# Patient Record
Sex: Female | Born: 1965
Health system: Southern US, Community
[De-identification: ages and names within clinical notes are randomized; demographics above are authoritative.]

## PROBLEM LIST (undated history)

## (undated) DIAGNOSIS — E039 Hypothyroidism, unspecified: Secondary | ICD-10-CM

## (undated) DIAGNOSIS — E119 Type 2 diabetes mellitus without complications: Secondary | ICD-10-CM

## (undated) DIAGNOSIS — R011 Cardiac murmur, unspecified: Secondary | ICD-10-CM

## (undated) HISTORY — PX: TOTAL THYROIDECTOMY: SHX2547

## (undated) HISTORY — PX: BREAST BIOPSY: SHX20

## (undated) HISTORY — PX: BREAST CYST EXCISION: SHX579

## (undated) HISTORY — PX: KNEE ARTHROSCOPY: SUR90

---

## 1997-05-18 ENCOUNTER — Ambulatory Visit (HOSPITAL_COMMUNITY): Admission: RE | Admit: 1997-05-18 | Discharge: 1997-05-18 | Payer: Self-pay

## 1997-11-26 ENCOUNTER — Other Ambulatory Visit: Admission: RE | Admit: 1997-11-26 | Discharge: 1997-11-26 | Payer: Self-pay | Admitting: Endocrinology

## 1999-02-28 ENCOUNTER — Ambulatory Visit (HOSPITAL_COMMUNITY): Admission: RE | Admit: 1999-02-28 | Discharge: 1999-02-28 | Payer: Self-pay | Admitting: Cardiology

## 1999-05-31 ENCOUNTER — Emergency Department (HOSPITAL_COMMUNITY): Admission: EM | Admit: 1999-05-31 | Discharge: 1999-05-31 | Payer: Self-pay | Admitting: Emergency Medicine

## 1999-05-31 ENCOUNTER — Encounter: Payer: Self-pay | Admitting: Emergency Medicine

## 2000-03-28 ENCOUNTER — Encounter: Payer: Self-pay | Admitting: Endocrinology

## 2000-03-28 ENCOUNTER — Ambulatory Visit (HOSPITAL_COMMUNITY): Admission: RE | Admit: 2000-03-28 | Discharge: 2000-03-28 | Payer: Self-pay | Admitting: Endocrinology

## 2000-04-13 ENCOUNTER — Other Ambulatory Visit: Admission: RE | Admit: 2000-04-13 | Discharge: 2000-04-13 | Payer: Self-pay | Admitting: Obstetrics & Gynecology

## 2000-04-27 ENCOUNTER — Other Ambulatory Visit: Admission: RE | Admit: 2000-04-27 | Discharge: 2000-04-27 | Payer: Self-pay | Admitting: Endocrinology

## 2001-02-22 ENCOUNTER — Inpatient Hospital Stay (HOSPITAL_COMMUNITY): Admission: AD | Admit: 2001-02-22 | Discharge: 2001-02-22 | Payer: Self-pay | Admitting: Obstetrics & Gynecology

## 2001-03-24 ENCOUNTER — Inpatient Hospital Stay (HOSPITAL_COMMUNITY): Admission: AD | Admit: 2001-03-24 | Discharge: 2001-03-26 | Payer: Self-pay | Admitting: Family Medicine

## 2001-04-02 ENCOUNTER — Encounter: Admission: RE | Admit: 2001-04-02 | Discharge: 2001-05-02 | Payer: Self-pay | Admitting: Obstetrics & Gynecology

## 2001-04-24 ENCOUNTER — Other Ambulatory Visit: Admission: RE | Admit: 2001-04-24 | Discharge: 2001-04-24 | Payer: Self-pay | Admitting: Obstetrics & Gynecology

## 2002-04-25 ENCOUNTER — Other Ambulatory Visit: Admission: RE | Admit: 2002-04-25 | Discharge: 2002-04-25 | Payer: Self-pay | Admitting: Obstetrics & Gynecology

## 2003-02-24 ENCOUNTER — Encounter (INDEPENDENT_AMBULATORY_CARE_PROVIDER_SITE_OTHER): Payer: Self-pay | Admitting: *Deleted

## 2003-02-24 ENCOUNTER — Observation Stay (HOSPITAL_COMMUNITY): Admission: RE | Admit: 2003-02-24 | Discharge: 2003-02-25 | Payer: Self-pay | Admitting: General Surgery

## 2003-05-01 ENCOUNTER — Other Ambulatory Visit: Admission: RE | Admit: 2003-05-01 | Discharge: 2003-05-01 | Payer: Self-pay | Admitting: Obstetrics & Gynecology

## 2004-06-13 ENCOUNTER — Other Ambulatory Visit: Admission: RE | Admit: 2004-06-13 | Discharge: 2004-06-13 | Payer: Self-pay | Admitting: Obstetrics & Gynecology

## 2005-06-21 ENCOUNTER — Other Ambulatory Visit: Admission: RE | Admit: 2005-06-21 | Discharge: 2005-06-21 | Payer: Self-pay | Admitting: Obstetrics & Gynecology

## 2006-09-22 ENCOUNTER — Emergency Department (HOSPITAL_COMMUNITY): Admission: EM | Admit: 2006-09-22 | Discharge: 2006-09-22 | Payer: Self-pay | Admitting: Emergency Medicine

## 2009-02-21 ENCOUNTER — Emergency Department (HOSPITAL_COMMUNITY): Admission: EM | Admit: 2009-02-21 | Discharge: 2009-02-21 | Payer: Self-pay | Admitting: Family Medicine

## 2010-04-28 ENCOUNTER — Ambulatory Visit
Admission: RE | Admit: 2010-04-28 | Discharge: 2010-04-28 | Payer: Self-pay | Source: Home / Self Care | Attending: Sports Medicine | Admitting: Sports Medicine

## 2010-04-28 DIAGNOSIS — M722 Plantar fascial fibromatosis: Secondary | ICD-10-CM | POA: Insufficient documentation

## 2010-04-28 DIAGNOSIS — M79609 Pain in unspecified limb: Secondary | ICD-10-CM | POA: Insufficient documentation

## 2010-05-05 NOTE — Assessment & Plan Note (Signed)
Summary: NP,SHIN PAIN,HEEL PAIN,MC   Vital Signs:  Patient profile:   45 year old female Height:      59 inches Weight:      169 pounds BMI:     34.26 BP sitting:   130 / 80  Vitals Entered By: Lillia Pauls CMA (April 28, 2010 8:39 AM) CC: b/l heel pain, shin pain   CC:  b/l heel pain and shin pain.  History of Present Illness: 45yo female to office w/ c/o b/l heel pain & b/l shin pain x 1 year. Hx of PF when pregnant (2003), heel pain feels similar to this.  Had cortisone injection at that time which was helpful. Having heel pain in AM when waking, no improvement thru the day.  Some improvement with cold soaks. No recent change in activity or exercise program. Walking about 1-hr 2-3 times/week at [facility], but can't do more b/c of pain. Taking Motrin before walks with some improvement, but pain still present. Pain worse when wearing flat shoes.  Wears [employer] shoes at work which are comfortable. Tried OTC insoles & custom orthotics previously (years ago), but did not help.  No longer has custom orthotics, but using OTC insoles. She gained a lot of weight from her pregnancy many years ago, is hoping to start exercising to loose weight.   Works as Engineer, civil (consulting) at [employer]: calcium, Vit D, Armor Thyroid  Preventive Screening-Counseling & Management  Alcohol-Tobacco     Smoking Status: never  Allergies (verified): No Known Drug Allergies  Past History:  Past Medical History: hypothyroid  Social History: Job: Engineer, civil (consulting) at [employer] Denies tobacco use Smoking Status:  never  Review of Systems       Per HPI, otherwise 10-pt ROS was negative  Physical Exam  General:  Well-developed,well-nourished,in no acute distress; alert,appropriate and cooperative throughout examination Head:  Normocephalic and atraumatic without obvious abnormalities.  Mouth:  MM moist & pink Neck:  supple.   Lungs:  normal respiratory effort.   Msk:  FEET/ANKLES: Full ROM in ankles &  foot without pain.  no hallux rigidus; good strength in dorsiflexion, plantar flexion, inversion and eversion; No tenderness over achilles.  TTP over medial insertion of plantar fascia b/l (R>L) and minimal TTP over peroneal tendons R>L; b/l collapse of longitudinal arch, transverse arches preserved.  Mild hindfoot valgus L>R; walking gait with pronation slight turn-out of R foot compared to left.   Pulses:  +2/4 DP & PT b/l Neurologic:  sensation intact to light touch.   Additional Exam:  MSK U/S: Feet- R PF thickness 0.66cm, small calcaneal spurring, & small calcification within PF near the calcaneous.  No signs of tears.  Lt PF with thickness 0.53cm, small calcaneal spur (smaller than on right), no signs of tears.  Normal appearing PF into the foot.  Images saved   Impression & Recommendations:  Problem # 1:  FOOT PAIN (ICD-729.5) - b/l foot pain secondary to plantar fasciitis - Fitted with arch straps - should use primarily on right, but may also use on left - Fitted with sports insoles in office today - these were comfortable to patient and gave her more neutral gait - Given handout on HEP & stretches to do on a daily basis - Cont. ice baths at end of the day or after workouts for discomfort - f/u 4-weeks for re-evaluation.  May consider custom orthotics in the future if symptoms persists. - Encouraged to call with any questions or concerns.  Orders: Foot  Orthosis ( Arch Strap/Heel Cup) [phone]) Sports Insoles [phone])  Problem # 2:  PLANTAR FASCIITIS, BILATERAL (ICD-728.71)  - b/l PF - R>L - see plan as outlined above  Orders: Sports Insoles (H0865)  Complete Medication List: 1)  Armour Thyroid 90 Mg Tabs (Thyroid) .... Take 1 by mouth once daily  Patient Instructions: 1)  You have plantar fasciitis of both of your feet. 2)  Wear new green insoles in all of your shoes. 3)  Wear arch straps with activity, especially on right. 4)  Do exercises at least twice daily as  demonstrated. 5)  Cont. ice baths at end of the day. 6)  follow-up with [place] in 4-weeks. 7)  Call with any questions or concerns.   Orders Added: 1)  Foot Orthosis ( Arch Strap/Heel Cup) [H8469] 2)  New Patient Level II [99202] 3)  Sports Insoles [L3510]

## 2010-05-30 ENCOUNTER — Encounter: Payer: Self-pay | Admitting: Sports Medicine

## 2010-05-30 ENCOUNTER — Ambulatory Visit (INDEPENDENT_AMBULATORY_CARE_PROVIDER_SITE_OTHER): Payer: Commercial Managed Care - PPO | Admitting: Sports Medicine

## 2010-05-30 DIAGNOSIS — R269 Unspecified abnormalities of gait and mobility: Secondary | ICD-10-CM

## 2010-05-30 DIAGNOSIS — M722 Plantar fascial fibromatosis: Secondary | ICD-10-CM

## 2010-06-09 NOTE — Assessment & Plan Note (Signed)
Summary: F/U APPT,MC   Vital Signs:  Patient profile:   45 year old female BP sitting:   132 / 85  Vitals Entered By: Lillia Pauls CMA (May 30, 2010 8:50 AM)  History of Present Illness: pt is here today to f/u with her B shin and heel pain which she sts is about 25% better. did not feel the arch strap was helpful so she d/c'ed it but has continued with the insoles which were helpful. Heel pain wakes her from sleep.   Walking 4-5 miles per day- does not have significant pain during walks.  PF pain is worst in the morning, and at the end of the day.    Allergies: No Known Drug Allergies  Physical Exam  General:  Well-developed,well-nourished,in no acute distress; alert,appropriate and cooperative throughout examination Msk:  Great toe function normal bilat Heels tender to palpation at insertion of PF  Standing signifcant protionation Loss of long arch bilat ER of feet bilat with walking    Impression & Recommendations:  Problem # 1:  PLANTAR FASCIITIS, BILATERAL (ICD-728.71)  Patient was fitted for a : standard, cushioned, semi-rigid orthotic. The orthotic was heated and afterward the patient stood on the orthotic blank positioned on the orthotic stand. The patient was positioned in subtalar neutral position and 10 degrees of ankle dorsiflexion in a weight bearing stance. After completion of molding, a stable base was applied to the orthotic blank. The blank was ground to a stable position for weight bearing. Size: 7 blue swirl Base: Blue EVA Posting: none Additional orthotic padding: none  these were comfortable and we were able to control the pronation w gait  Walking gait neutral with orthotics  Orders: Orthotic Materials, each unit (Z6109)  Problem # 2:  ABNORMALITY OF GAIT (ICD-781.2)  This correct nicely w orthotics  uses these as much as possible for work and walking  REck in 3 mos and repeat scan of PF  OK to walk but keep up exercises and  stretches and do not increase distance until feeling better  Orders: Orthotic Materials, each unit (L3002)  Complete Medication List: 1)  Armour Thyroid 90 Mg Tabs (Thyroid) .... Take 1 by mouth once daily   Orders Added: 1)  Est. Patient Level IV [60454] 2)  Orthotic Materials, each unit [L3002]

## 2010-08-19 NOTE — Op Note (Signed)
NAMEGENEVA, BARRERO                      ACCOUNT NO.:  [id number]   MEDICAL RECORD NO.:  [id number]                   PATIENT TYPE:  AMB   LOCATION:  DAY                                  FACILITY:  [facility]   PHYSICIAN:  Adolph Pollack, M.D.            DATE OF BIRTH:  08-26-1965   DATE OF PROCEDURE:  02/24/2003  DATE OF DISCHARGE:                                 OPERATIVE REPORT   PREOPERATIVE DIAGNOSIS:  Follicular neoplasm, left lobe of the thyroid.   POSTOPERATIVE DIAGNOSES:  1. Follicular neoplasm, left lobe of the thyroid.  2. Right thyroid nodule.   PROCEDURE:  Total thyroidectomy.   SURGEON:  Adolph Pollack, M.D.   ASSISTANT:  Lorne Skeens. Hoxworth, M.D.   ANESTHESIA:  General.   INDICATIONS:  Julia Hughes is a 45 year old female who has had a recurrent  thyroid cyst and mass.  It has been aspirated multiple times.  It returned  some follicular cells with Hurthle-type changes.  It has been suggested by  Dr. Lucianne Muss that she have it removed.  She now presents for the procedure.  We  talked about the procedure and the risks, including the potential of needing  total thyroidectomy.   TECHNIQUE:  She was seen in the holding area and brought to the operating  room, placed supine on the operating table, and a general anesthetic was  administered.  A roll was placed under her shoulders and the head was  slightly extended.  The neck and upper chest and chin were then sterilely  prepped and draped.  A curvilinear incision was made approximately one  fingerbreadth above the head of the clavicles in a transverse fashion.  This  was performed through the skin, skin and subcutaneous tissue, and platysma  muscle.  Subplatysmal flaps were then raised superiorly to the laryngeal  prominence and inferiorly to the sternal notch.  The strap muscles were  identified and then the fascia between the left and right strap muscles was  divided with the cautery.  I dissected the strap  muscles free from a cystic-  type mass on the left lobe of the thyroid gland.  The muscle was quite  adherent to it, and it took some blunt dissection as well as cautery.  I  then began mobilizing the inferior aspect of the thyroid gland by keeping my  dissection on the native thyroid gland and dividing the vessels between  clips and cutting them.  The middle thyroidal vein was also identified,  clipped, and cut.  __________ to the superior thyroid area and mobilized and  isolated the superior vessels close to the capsule of the thyroid gland.  These were ligated, then divided.  Smaller vessels coming onto the capsule  of the thyroid were divided between small hemoclips.  I retracted the  thyroid anteriorly.  I identified what I felt was the left inferior  parathyroid gland but did not definitely see a left superior  thyroid gland.  I did identify the recurrent laryngeal nerve, and it was kept posterior to  the plane of dissection.  I then used electrocautery to dissect the thyroid  gland and the large cystic thyroid mass off of the trachea to the midportion  of the trachea.   I inspected the right side and palpated it and noted it to have at least a  centimeter nodule and some adherent muscle to the thyroid.  Using blunt  dissection and the cautery, I mobilized the strap muscles away from the  thyroid gland.  I then began dissection inferiorly by dividing the inferior  vessels close to the thyroid between clips.  I identified what I felt was a  right inferior parathyroid gland and preserved that.  I then approached the  superior aspect of the thyroid gland and mobilized the superior vessels and  isolated them.  I then divided them between ties.  The suspensory ligament  of Allyson Sabal was also divided between ties.  I dissected down in the  tracheoesophageal groove and identified the right recurrent laryngeal nerve.  I then divided the middle thyroid vessels between clips close to the thyroid   capsule and began mobilizing the thyroid off the trachea from inferiorly to  superiorly.  Part of the thyroid was very close to the recurrent nerve, and  I left a very small amount of thyroid tissue adjacent to the recurrent  nerve.   I then mobilized the pyramidal lobe and dissected it free from the trachea,  suture ligating some of its vascular attachments.  The specimen was then  handed off the field.  It was sent permanent to pathology.   I then reinspected both areas.  I did note a superior right parathyroid  gland during the last portion of my dissection, which was preserved.  I  identified both the recurrent laryngeal nerves again, and they did not  appear harmed.  I irrigated out the wounds and hemostasis appeared adequate.  A piece of Surgicel was placed on each side.  Needle, sponge, and instrument  counts were reported to be correct.  I then closed the strap muscles over  the trachea with interrupted 3-0 Vicryl sutures.  The platysma muscle was  closed with interrupted 3-0 Vicryl sutures.  The skin was closed with a  running 4-0 Monocryl subcuticular stitch, followed by Steri-Strips and a  sterile dressing.   She tolerated the procedure well without any apparent complications.  She  subsequently was taken to the recovery room in satisfactory condition.                                               Adolph Pollack, M.D.    Kari Baars  D:  02/24/2003  T:  02/24/2003  Job:  [id number]   cc:   Reather Littler, M.D.  [address]  [place]  [place] [address]  Fax: [phone]

## 2010-10-06 ENCOUNTER — Ambulatory Visit: Payer: Commercial Managed Care - PPO | Admitting: Sports Medicine

## 2010-10-27 ENCOUNTER — Encounter: Payer: Self-pay | Admitting: Sports Medicine

## 2010-10-27 ENCOUNTER — Ambulatory Visit (INDEPENDENT_AMBULATORY_CARE_PROVIDER_SITE_OTHER): Payer: 59 | Admitting: Sports Medicine

## 2010-10-27 VITALS — BP 128/87 | HR 67

## 2010-10-27 DIAGNOSIS — M722 Plantar fascial fibromatosis: Secondary | ICD-10-CM

## 2010-10-28 NOTE — Progress Notes (Signed)
  Subjective:    Patient ID: Julia Hughes, female    DOB: 1965-09-25, 45 y.o.   MRN: [id number]  HPI Julia Hughes is 45 yo female patient coming to f/u on her B/L plantar fascitis from her previous visit on 06/2010. Her heel pain has not improved, the R side is worse than the left side. She had severe heel pain in the past about 10 years ago which improved with a cortisone shot. 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 She would like to try cortisone shots as a treatment.  No past medical history on file. No current outpatient prescriptions on file prior to visit.   No Known Allergies History   Social History  . Marital Status: Married    Spouse Name: N/A    Number of Children: N/A  . Years of Education: N/A   Social History Main Topics  . Smoking status: Never Smoker   . Smokeless tobacco: Never Used  . Alcohol Use: None  . Drug Use: None  . Sexually Active: None   Other Topics Concern  . None   Social History Narrative  . None    Review of Systems  Constitutional: Negative for fever, chills and fatigue.  Musculoskeletal:       Heel pain with HPI       Objective:   Physical Exam  Constitutional: She appears well-developed and well-nourished.       BP 128/87  Pulse 67   Pulmonary/Chest: Effort normal and breath sounds normal.  Musculoskeletal:       B/L heel with intact skin, no swelling no hematomas, TTP in medial heel and in the plantar fascia in the feet arch. Sensation intact distally.   Neurological: She is alert.  Skin: No rash noted. No erythema.  Psychiatric: She has a normal mood and affect. Judgment normal.   MSK U/S:  R Heel with mild swelling in the plantar fascia with 0.44 mm. Bone spur  present. L heel with mild swelling in the plantar fascia with 0.42 mm. Bone spur present.  After obtaining consent a steroid injection was performed at both heels using ethyl chloride and 1% plain Lidocaine and 10 mg of Kenalog. This was well tolerated. No complications.    Assessment & Plan:   1. Plantar fasciitis    B/L plantar fascia cortisone injections done today F/U in 4 weeks to asses progression. If no improvement noticed we will consider night splint.  Keep up exercises and stretches Use ice more  Consistently after working longer shifts

## 2010-11-28 ENCOUNTER — Inpatient Hospital Stay (INDEPENDENT_AMBULATORY_CARE_PROVIDER_SITE_OTHER)
Admission: RE | Admit: 2010-11-28 | Discharge: 2010-11-28 | Disposition: A | Discharge status: Home / Self Care | Payer: 59 | Source: Ambulatory Visit | Attending: Family Medicine | Admitting: Family Medicine

## 2010-11-28 DIAGNOSIS — F410 Panic disorder [episodic paroxysmal anxiety] without agoraphobia: Secondary | ICD-10-CM

## 2010-11-28 LAB — POCT URINALYSIS DIP (DEVICE)
Bilirubin Urine: NEGATIVE
Glucose, UA: NEGATIVE mg/dL
Ketones, ur: NEGATIVE mg/dL
Nitrite: NEGATIVE
Protein, ur: NEGATIVE mg/dL
Specific Gravity, Urine: 1.01 (ref 1.005–1.030)
Urobilinogen, UA: 0.2 mg/dL (ref 0.0–1.0)
pH: 7 (ref 5.0–8.0)

## 2010-11-28 LAB — POCT PREGNANCY, URINE: Preg Test, Ur: NEGATIVE

## 2010-11-28 LAB — POCT I-STAT, CHEM 8
HCT: 41 % (ref 36.0–46.0)
Hemoglobin: 13.9 g/dL (ref 12.0–15.0)
Potassium: 3.7 mEq/L (ref 3.5–5.1)
Sodium: 136 mEq/L (ref 135–145)

## 2012-12-26 ENCOUNTER — Other Ambulatory Visit: Payer: Self-pay

## 2015-04-20 DIAGNOSIS — R69 Illness, unspecified: Secondary | ICD-10-CM | POA: Diagnosis not present

## 2015-04-20 MED FILL — HYDROCODONE-HOMATROPINE SYR: 5-1.5 | 6 days supply | Qty: 120 | Fill #0

## 2015-04-28 DIAGNOSIS — H01029 Squamous blepharitis unspecified eye, unspecified eyelid: Secondary | ICD-10-CM | POA: Diagnosis not present

## 2015-04-28 DIAGNOSIS — H43391 Other vitreous opacities, right eye: Secondary | ICD-10-CM | POA: Diagnosis not present

## 2015-05-06 MED FILL — ARMOUR THYROID 90 MG TABLET: 90 | 90 days supply | Qty: 90 | Fill #1

## 2015-05-27 DIAGNOSIS — E890 Postprocedural hypothyroidism: Secondary | ICD-10-CM | POA: Diagnosis not present

## 2015-05-27 DIAGNOSIS — E559 Vitamin D deficiency, unspecified: Secondary | ICD-10-CM | POA: Diagnosis not present

## 2015-05-27 DIAGNOSIS — R7301 Impaired fasting glucose: Secondary | ICD-10-CM | POA: Diagnosis not present

## 2015-05-27 DIAGNOSIS — E538 Deficiency of other specified B group vitamins: Secondary | ICD-10-CM | POA: Diagnosis not present

## 2015-07-08 DIAGNOSIS — Z6834 Body mass index (BMI) 34.0-34.9, adult: Secondary | ICD-10-CM | POA: Diagnosis not present

## 2015-07-08 DIAGNOSIS — Z01419 Encounter for gynecological examination (general) (routine) without abnormal findings: Secondary | ICD-10-CM | POA: Diagnosis not present

## 2015-07-08 DIAGNOSIS — Z1231 Encounter for screening mammogram for malignant neoplasm of breast: Secondary | ICD-10-CM | POA: Diagnosis not present

## 2015-07-08 MED FILL — valACYclovir HCL 1 GM TABS: 1 | 15 days supply | Qty: 30 | Fill #0

## 2015-07-19 DIAGNOSIS — R300 Dysuria: Secondary | ICD-10-CM | POA: Diagnosis not present

## 2015-07-22 MED FILL — SULFAMETHOXAZOLE/TMP DS TAB: 800-160 | 5 days supply | Qty: 10 | Fill #0

## 2015-08-03 DIAGNOSIS — E890 Postprocedural hypothyroidism: Secondary | ICD-10-CM | POA: Diagnosis not present

## 2015-08-05 MED FILL — ARMOUR THYROID 90 MG TABLET: 90 | 90 days supply | Qty: 90 | Fill #0

## 2015-10-14 DIAGNOSIS — E890 Postprocedural hypothyroidism: Secondary | ICD-10-CM | POA: Diagnosis not present

## 2015-10-18 DIAGNOSIS — Z6832 Body mass index (BMI) 32.0-32.9, adult: Secondary | ICD-10-CM | POA: Diagnosis not present

## 2015-10-18 DIAGNOSIS — Z0000 Encounter for general adult medical examination without abnormal findings: Secondary | ICD-10-CM | POA: Diagnosis not present

## 2015-10-18 DIAGNOSIS — R7303 Prediabetes: Secondary | ICD-10-CM | POA: Diagnosis not present

## 2015-10-18 DIAGNOSIS — E039 Hypothyroidism, unspecified: Secondary | ICD-10-CM | POA: Diagnosis not present

## 2015-10-18 DIAGNOSIS — E669 Obesity, unspecified: Secondary | ICD-10-CM | POA: Diagnosis not present

## 2015-10-19 DIAGNOSIS — Z6832 Body mass index (BMI) 32.0-32.9, adult: Secondary | ICD-10-CM | POA: Diagnosis not present

## 2015-10-19 DIAGNOSIS — E039 Hypothyroidism, unspecified: Secondary | ICD-10-CM | POA: Diagnosis not present

## 2015-10-19 DIAGNOSIS — R7303 Prediabetes: Secondary | ICD-10-CM | POA: Diagnosis not present

## 2015-10-19 DIAGNOSIS — Z0000 Encounter for general adult medical examination without abnormal findings: Secondary | ICD-10-CM | POA: Diagnosis not present

## 2015-10-19 DIAGNOSIS — E669 Obesity, unspecified: Secondary | ICD-10-CM | POA: Diagnosis not present

## 2015-10-28 MED FILL — ARMOUR THYROID 60 MG TABLET: 60 | 84 days supply | Qty: 24 | Fill #0

## 2015-10-28 MED FILL — ARMOUR THYROID 90 MG TABLET: 90 | 90 days supply | Qty: 60 | Fill #0

## 2015-11-01 DIAGNOSIS — Z4422 Encounter for fitting and adjustment of artificial left eye: Secondary | ICD-10-CM | POA: Diagnosis not present

## 2015-11-25 DIAGNOSIS — H1032 Unspecified acute conjunctivitis, left eye: Secondary | ICD-10-CM | POA: Diagnosis not present

## 2015-12-16 MED FILL — GAVILYTE-N SOLUTION: 420 | 1 days supply | Qty: 4000 | Fill #0

## 2015-12-30 DIAGNOSIS — Z1211 Encounter for screening for malignant neoplasm of colon: Secondary | ICD-10-CM | POA: Diagnosis not present

## 2015-12-30 DIAGNOSIS — K5730 Diverticulosis of large intestine without perforation or abscess without bleeding: Secondary | ICD-10-CM | POA: Diagnosis not present

## 2016-01-31 MED FILL — ARMOUR THYROID 60 MG TABLET: 60 | 84 days supply | Qty: 24 | Fill #1

## 2016-02-11 MED FILL — ARMOUR THYROID 90 MG TABLET: 90 | 90 days supply | Qty: 60 | Fill #1

## 2016-04-25 MED FILL — ARMOUR THYROID 60 MG TABLET: 60 | 28 days supply | Qty: 8 | Fill #2

## 2016-04-28 MED FILL — ARMOUR THYROID 90 MG TABLET: 90 | 28 days supply | Qty: 20 | Fill #2

## 2016-05-18 DIAGNOSIS — R7301 Impaired fasting glucose: Secondary | ICD-10-CM | POA: Diagnosis not present

## 2016-05-18 DIAGNOSIS — E890 Postprocedural hypothyroidism: Secondary | ICD-10-CM | POA: Diagnosis not present

## 2016-05-26 DIAGNOSIS — E559 Vitamin D deficiency, unspecified: Secondary | ICD-10-CM | POA: Diagnosis not present

## 2016-05-26 DIAGNOSIS — E890 Postprocedural hypothyroidism: Secondary | ICD-10-CM | POA: Diagnosis not present

## 2016-05-26 DIAGNOSIS — R7301 Impaired fasting glucose: Secondary | ICD-10-CM | POA: Diagnosis not present

## 2016-05-26 MED FILL — ARMOUR THYROID 60 MG TABLET: 60 | 30 days supply | Qty: 34 | Fill #0

## 2016-06-05 MED FILL — NAPROXEN SODIUM 550 MG TAB: 550 | 6 days supply | Qty: 12 | Fill #0

## 2016-06-05 MED FILL — CHLORHEXIDINE 0.12% RINSE: 0.12 | 17 days supply | Qty: 473 | Fill #0

## 2016-06-29 MED FILL — ARMOUR THYROID 60 MG TABLET: 60 | 30 days supply | Qty: 34 | Fill #1

## 2016-07-07 DIAGNOSIS — E890 Postprocedural hypothyroidism: Secondary | ICD-10-CM | POA: Diagnosis not present

## 2016-07-14 MED FILL — ARMOUR THYROID 90 MG TABLET: 90 | 30 days supply | Qty: 30 | Fill #0

## 2016-08-17 MED FILL — ARMOUR THYROID 90 MG TABLET: 90 | 30 days supply | Qty: 30 | Fill #1

## 2016-09-15 DIAGNOSIS — E890 Postprocedural hypothyroidism: Secondary | ICD-10-CM | POA: Diagnosis not present

## 2016-09-18 MED FILL — ARMOUR THYROID 90 MG TABLET: 90 | 30 days supply | Qty: 30 | Fill #2

## 2016-10-12 MED FILL — ARMOUR THYROID 90 MG TABLET: 90 | 30 days supply | Qty: 30 | Fill #3

## 2016-11-13 MED FILL — ARMOUR THYROID 90 MG TABLET: 90 | 30 days supply | Qty: 30 | Fill #4

## 2016-12-05 DIAGNOSIS — Z1231 Encounter for screening mammogram for malignant neoplasm of breast: Secondary | ICD-10-CM | POA: Diagnosis not present

## 2016-12-12 MED FILL — ARMOUR THYROID 90 MG TABLET: 90 | 30 days supply | Qty: 30 | Fill #5

## 2016-12-14 ENCOUNTER — Ambulatory Visit: Payer: 59 | Admitting: Sports Medicine

## 2016-12-26 ENCOUNTER — Ambulatory Visit (INDEPENDENT_AMBULATORY_CARE_PROVIDER_SITE_OTHER): Payer: 59 | Admitting: Sports Medicine

## 2016-12-26 VITALS — BP 114/78 | Ht 60.0 in | Wt 160.0 lb

## 2016-12-26 DIAGNOSIS — M722 Plantar fascial fibromatosis: Secondary | ICD-10-CM | POA: Diagnosis not present

## 2016-12-27 NOTE — Progress Notes (Signed)
   Subjective:    Patient ID: Julia Hughes, female    DOB: 13-Mar-1966, 51 y.o.   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 She has a history of plantar fasciitis. She's had several forms of treatment in the past including cortisone injections. Treatments have been only temporarily helpful. Most helpful has been custom orthotics. She feels like they are worn out and she may benefit from a new pair. Her pain is worse at the end of a long shift. She denies significant pain with first getting up in the morning or with first standing. Pain is diffuse along the bottom of her foot. No swelling. No numbness or tingling. No recent trauma. Pain is similar in nature to what she has experienced previously.  Past medical history reviewed Medications reviewed Allergies reviewed    Review of Systems As above    Objective:   Physical Exam  Well-developed, well-nourished. No acute distress. Awake alert and oriented 3. Vital signs reviewed  Examination of both feet show loss of both transverse and longitudinal arches with standing. Pronation with walking. Slight tenderness to palpation at the calcaneal origin of the plantar fascia. Negative calcaneal squeeze. No soft tissue swelling. Good pulses. Neurovascularly intact distally.  MSK ultrasound of both heels were obtained. Limited images were done. Right plantar fascia measures 0.38 cm at the origin. Left plantar fascia measures 0.41 cm at the origin. A small bone spur is seen in both heels as well.       Assessment & Plan:   Bilateral foot pain likely secondary to plantar fasciitis Pes planus  Patient would definitely benefit from new custom orthotics, especially given the nature of her job. Unfortunately, she did not bring her shoes with her today. She will return to the office in a few days with her tennis shoes and we will make her new custom  orthotics at that time. She is already well-versed in plantar fascial stretches and icing techniques.

## 2017-01-15 ENCOUNTER — Encounter: Payer: 59 | Admitting: Sports Medicine

## 2017-01-16 MED FILL — ARMOUR THYROID 90 MG TABLET: 90 | 30 days supply | Qty: 30 | Fill #6

## 2017-01-22 ENCOUNTER — Ambulatory Visit (INDEPENDENT_AMBULATORY_CARE_PROVIDER_SITE_OTHER): Payer: 59 | Admitting: Sports Medicine

## 2017-01-22 VITALS — BP 142/82 | Ht 59.0 in | Wt 168.0 lb

## 2017-01-22 DIAGNOSIS — M722 Plantar fascial fibromatosis: Secondary | ICD-10-CM | POA: Diagnosis not present

## 2017-01-22 NOTE — Progress Notes (Signed)
  Patient comes in today for custom orthotics. Please see the office note from 12/26/2016 for details regarding history and physical exam findings.  Total of 30 minutes was spent with the patient with greater than 50% of the time spent in face-to-face consultation discussing orthotic construction, instruction, and fitting. Patient found her orthotics to be comfortable prior to leaving the office. Gait was neutral with orthotics in place. Follow-up as needed.  Patient was fitted for a : standard, cushioned, semi-rigid orthotic. The orthotic was heated and afterward the patient stood on the orthotic blank positioned on the orthotic stand. The patient was positioned in subtalar neutral position and 10 degrees of ankle dorsiflexion in a weight bearing stance. After completion of molding, a stable base was applied to the orthotic blank. The blank was ground to a stable position for weight bearing. Size: 6 Base: Blue EVA Posting: none Additional orthotic padding: none

## 2017-01-26 MED FILL — valACYclovir HCL 1 GM TABS: 1 | 15 days supply | Qty: 30 | Fill #0

## 2017-02-13 MED FILL — ARMOUR THYROID 90 MG TABLET: 90 | 90 days supply | Qty: 90 | Fill #0

## 2017-04-11 DIAGNOSIS — E039 Hypothyroidism, unspecified: Secondary | ICD-10-CM | POA: Diagnosis not present

## 2017-04-11 DIAGNOSIS — E559 Vitamin D deficiency, unspecified: Secondary | ICD-10-CM | POA: Diagnosis not present

## 2017-04-11 DIAGNOSIS — Z0000 Encounter for general adult medical examination without abnormal findings: Secondary | ICD-10-CM | POA: Diagnosis not present

## 2017-04-11 DIAGNOSIS — E669 Obesity, unspecified: Secondary | ICD-10-CM | POA: Diagnosis not present

## 2017-04-11 DIAGNOSIS — R7303 Prediabetes: Secondary | ICD-10-CM | POA: Diagnosis not present

## 2017-04-11 DIAGNOSIS — Z1211 Encounter for screening for malignant neoplasm of colon: Secondary | ICD-10-CM | POA: Diagnosis not present

## 2017-05-14 MED FILL — ARMOUR THYROID 90 MG TABLET: 90 | 90 days supply | Qty: 90 | Fill #1

## 2017-05-21 DIAGNOSIS — E559 Vitamin D deficiency, unspecified: Secondary | ICD-10-CM | POA: Diagnosis not present

## 2017-05-21 DIAGNOSIS — E890 Postprocedural hypothyroidism: Secondary | ICD-10-CM | POA: Diagnosis not present

## 2017-05-21 DIAGNOSIS — R7301 Impaired fasting glucose: Secondary | ICD-10-CM | POA: Diagnosis not present

## 2017-05-25 DIAGNOSIS — R7301 Impaired fasting glucose: Secondary | ICD-10-CM | POA: Diagnosis not present

## 2017-05-25 DIAGNOSIS — E890 Postprocedural hypothyroidism: Secondary | ICD-10-CM | POA: Diagnosis not present

## 2017-05-25 DIAGNOSIS — E559 Vitamin D deficiency, unspecified: Secondary | ICD-10-CM | POA: Diagnosis not present

## 2017-08-28 DIAGNOSIS — E890 Postprocedural hypothyroidism: Secondary | ICD-10-CM | POA: Diagnosis not present

## 2017-08-28 MED FILL — ARMOUR THYROID 90 MG TABLET: 90 | 84 days supply | Qty: 72 | Fill #0

## 2017-09-28 DIAGNOSIS — M79605 Pain in left leg: Secondary | ICD-10-CM | POA: Diagnosis not present

## 2017-11-13 MED FILL — ARMOUR THYROID 90 MG TABLET: 90 | 84 days supply | Qty: 72 | Fill #1

## 2017-11-21 DIAGNOSIS — Z01419 Encounter for gynecological examination (general) (routine) without abnormal findings: Secondary | ICD-10-CM | POA: Diagnosis not present

## 2017-11-21 DIAGNOSIS — Z124 Encounter for screening for malignant neoplasm of cervix: Secondary | ICD-10-CM | POA: Diagnosis not present

## 2017-11-21 DIAGNOSIS — N939 Abnormal uterine and vaginal bleeding, unspecified: Secondary | ICD-10-CM | POA: Diagnosis not present

## 2017-11-22 DIAGNOSIS — N950 Postmenopausal bleeding: Secondary | ICD-10-CM | POA: Diagnosis not present

## 2017-11-22 DIAGNOSIS — N859 Noninflammatory disorder of uterus, unspecified: Secondary | ICD-10-CM | POA: Diagnosis not present

## 2017-12-05 DIAGNOSIS — Z970 Presence of artificial eye: Secondary | ICD-10-CM | POA: Diagnosis not present

## 2017-12-05 DIAGNOSIS — Q112 Microphthalmos: Secondary | ICD-10-CM | POA: Diagnosis not present

## 2017-12-10 DIAGNOSIS — Z1231 Encounter for screening mammogram for malignant neoplasm of breast: Secondary | ICD-10-CM | POA: Diagnosis not present

## 2018-02-04 MED FILL — ARMOUR THYROID 90 MG TABLET: 90 | 84 days supply | Qty: 72 | Fill #2

## 2018-04-22 MED FILL — valACYclovir HCL 1 GM TABS: 1 | 7 days supply | Qty: 28 | Fill #0

## 2018-04-23 MED FILL — ARMOUR THYROID 90 MG TABLET: 90 | 84 days supply | Qty: 72 | Fill #3

## 2018-06-28 DIAGNOSIS — E119 Type 2 diabetes mellitus without complications: Secondary | ICD-10-CM | POA: Diagnosis not present

## 2018-06-28 DIAGNOSIS — E559 Vitamin D deficiency, unspecified: Secondary | ICD-10-CM | POA: Diagnosis not present

## 2018-06-28 DIAGNOSIS — R7301 Impaired fasting glucose: Secondary | ICD-10-CM | POA: Diagnosis not present

## 2018-07-04 MED FILL — ARMOUR THYROID 90 MG TABLET: 90 | 90 days supply | Qty: 90 | Fill #0

## 2018-10-10 MED FILL — ARMOUR THYROID 90 MG TABLET: 90 | 90 days supply | Qty: 90 | Fill #0

## 2018-10-22 DIAGNOSIS — D225 Melanocytic nevi of trunk: Secondary | ICD-10-CM | POA: Diagnosis not present

## 2018-10-22 DIAGNOSIS — I781 Nevus, non-neoplastic: Secondary | ICD-10-CM | POA: Diagnosis not present

## 2019-01-10 DIAGNOSIS — Z124 Encounter for screening for malignant neoplasm of cervix: Secondary | ICD-10-CM | POA: Diagnosis not present

## 2019-01-10 DIAGNOSIS — Z1231 Encounter for screening mammogram for malignant neoplasm of breast: Secondary | ICD-10-CM | POA: Diagnosis not present

## 2019-01-10 DIAGNOSIS — Z01419 Encounter for gynecological examination (general) (routine) without abnormal findings: Secondary | ICD-10-CM | POA: Diagnosis not present

## 2019-01-16 MED FILL — ARMOUR THYROID 90 MG TABLET: 90 | 90 days supply | Qty: 90 | Fill #0

## 2019-04-30 MED FILL — ARMOUR THYROID 90 MG TABLET: 90 | 90 days supply | Qty: 90 | Fill #0

## 2019-05-28 DIAGNOSIS — Z970 Presence of artificial eye: Secondary | ICD-10-CM | POA: Diagnosis not present

## 2019-05-28 DIAGNOSIS — H2511 Age-related nuclear cataract, right eye: Secondary | ICD-10-CM | POA: Diagnosis not present

## 2019-05-28 DIAGNOSIS — Q112 Microphthalmos: Secondary | ICD-10-CM | POA: Diagnosis not present

## 2019-11-14 DIAGNOSIS — R7301 Impaired fasting glucose: Secondary | ICD-10-CM | POA: Diagnosis not present

## 2019-11-14 DIAGNOSIS — E890 Postprocedural hypothyroidism: Secondary | ICD-10-CM | POA: Diagnosis not present

## 2019-11-14 DIAGNOSIS — E119 Type 2 diabetes mellitus without complications: Secondary | ICD-10-CM | POA: Diagnosis not present

## 2019-11-14 DIAGNOSIS — E559 Vitamin D deficiency, unspecified: Secondary | ICD-10-CM | POA: Diagnosis not present

## 2019-11-21 DIAGNOSIS — E890 Postprocedural hypothyroidism: Secondary | ICD-10-CM | POA: Diagnosis not present

## 2019-11-21 DIAGNOSIS — E119 Type 2 diabetes mellitus without complications: Secondary | ICD-10-CM | POA: Diagnosis not present

## 2019-11-21 DIAGNOSIS — R300 Dysuria: Secondary | ICD-10-CM | POA: Diagnosis not present

## 2019-11-21 DIAGNOSIS — R7301 Impaired fasting glucose: Secondary | ICD-10-CM | POA: Diagnosis not present

## 2019-11-21 DIAGNOSIS — E559 Vitamin D deficiency, unspecified: Secondary | ICD-10-CM | POA: Diagnosis not present

## 2019-11-21 DIAGNOSIS — E7800 Pure hypercholesterolemia, unspecified: Secondary | ICD-10-CM | POA: Diagnosis not present

## 2019-11-21 MED FILL — ARMOUR THYROID 90 MG TABLET: 90 | 90 days supply | Qty: 90 | Fill #0

## 2019-11-25 MED FILL — FLUCONAZOLE 150 MG TABS: 150 | 3 days supply | Qty: 3 | Fill #0

## 2019-11-25 MED FILL — FREESTYLE LITE TEST STRIP: 50 days supply | Qty: 50 | Fill #0

## 2019-11-25 MED FILL — FREESTYLE LITE METER: 1 days supply | Qty: 1 | Fill #0

## 2019-11-25 MED FILL — FREESTYLE LANCETS: 90 days supply | Qty: 100 | Fill #0

## 2020-02-10 DIAGNOSIS — Z01419 Encounter for gynecological examination (general) (routine) without abnormal findings: Secondary | ICD-10-CM | POA: Diagnosis not present

## 2020-02-10 DIAGNOSIS — Z1231 Encounter for screening mammogram for malignant neoplasm of breast: Secondary | ICD-10-CM | POA: Diagnosis not present

## 2020-03-02 ENCOUNTER — Other Ambulatory Visit (HOSPITAL_COMMUNITY): Payer: Self-pay | Admitting: Obstetrics

## 2020-03-02 MED FILL — valACYclovir HCL 1 GM TABS: 1 | 7 days supply | Qty: 28 | Fill #0

## 2020-03-08 ENCOUNTER — Other Ambulatory Visit (HOSPITAL_COMMUNITY): Payer: Self-pay | Admitting: Endocrinology

## 2020-03-08 MED FILL — ARMOUR THYROID 90 MG TABLET: 90 | 90 days supply | Qty: 90 | Fill #0

## 2020-03-12 DIAGNOSIS — E890 Postprocedural hypothyroidism: Secondary | ICD-10-CM | POA: Diagnosis not present

## 2020-03-12 DIAGNOSIS — E559 Vitamin D deficiency, unspecified: Secondary | ICD-10-CM | POA: Diagnosis not present

## 2020-03-12 DIAGNOSIS — E119 Type 2 diabetes mellitus without complications: Secondary | ICD-10-CM | POA: Diagnosis not present

## 2020-03-12 DIAGNOSIS — R7301 Impaired fasting glucose: Secondary | ICD-10-CM | POA: Diagnosis not present

## 2020-03-12 DIAGNOSIS — E7800 Pure hypercholesterolemia, unspecified: Secondary | ICD-10-CM | POA: Diagnosis not present

## 2020-03-19 DIAGNOSIS — E119 Type 2 diabetes mellitus without complications: Secondary | ICD-10-CM | POA: Diagnosis not present

## 2020-03-19 DIAGNOSIS — E7800 Pure hypercholesterolemia, unspecified: Secondary | ICD-10-CM | POA: Diagnosis not present

## 2020-03-19 DIAGNOSIS — E890 Postprocedural hypothyroidism: Secondary | ICD-10-CM | POA: Diagnosis not present

## 2020-03-19 DIAGNOSIS — E559 Vitamin D deficiency, unspecified: Secondary | ICD-10-CM | POA: Diagnosis not present

## 2020-03-19 DIAGNOSIS — R7301 Impaired fasting glucose: Secondary | ICD-10-CM | POA: Diagnosis not present

## 2020-05-20 DIAGNOSIS — E119 Type 2 diabetes mellitus without complications: Secondary | ICD-10-CM | POA: Diagnosis not present

## 2020-05-20 DIAGNOSIS — E559 Vitamin D deficiency, unspecified: Secondary | ICD-10-CM | POA: Diagnosis not present

## 2020-05-20 DIAGNOSIS — E890 Postprocedural hypothyroidism: Secondary | ICD-10-CM | POA: Diagnosis not present

## 2020-05-31 DIAGNOSIS — Z970 Presence of artificial eye: Secondary | ICD-10-CM | POA: Diagnosis not present

## 2020-05-31 DIAGNOSIS — Q112 Microphthalmos: Secondary | ICD-10-CM | POA: Diagnosis not present

## 2020-05-31 DIAGNOSIS — H2511 Age-related nuclear cataract, right eye: Secondary | ICD-10-CM | POA: Diagnosis not present

## 2020-06-09 MED FILL — ARMOUR THYROID 90 MG TABLET: 90 | 90 days supply | Qty: 90 | Fill #1

## 2020-09-16 ENCOUNTER — Other Ambulatory Visit (HOSPITAL_COMMUNITY): Payer: Self-pay

## 2020-09-16 MED FILL — Thyroid Tab 90 MG (1 1/2 Grain): ORAL | 90 days supply | Qty: 90 | Fill #0 | Status: AC

## 2020-09-17 ENCOUNTER — Other Ambulatory Visit (HOSPITAL_COMMUNITY): Payer: Self-pay

## 2020-12-02 DIAGNOSIS — R7303 Prediabetes: Secondary | ICD-10-CM | POA: Diagnosis not present

## 2020-12-02 DIAGNOSIS — E7800 Pure hypercholesterolemia, unspecified: Secondary | ICD-10-CM | POA: Diagnosis not present

## 2020-12-02 DIAGNOSIS — E119 Type 2 diabetes mellitus without complications: Secondary | ICD-10-CM | POA: Diagnosis not present

## 2020-12-02 DIAGNOSIS — E559 Vitamin D deficiency, unspecified: Secondary | ICD-10-CM | POA: Diagnosis not present

## 2020-12-02 DIAGNOSIS — E890 Postprocedural hypothyroidism: Secondary | ICD-10-CM | POA: Diagnosis not present

## 2020-12-02 DIAGNOSIS — E039 Hypothyroidism, unspecified: Secondary | ICD-10-CM | POA: Diagnosis not present

## 2020-12-07 ENCOUNTER — Ambulatory Visit (INDEPENDENT_AMBULATORY_CARE_PROVIDER_SITE_OTHER): Payer: 59 | Admitting: Sports Medicine

## 2020-12-07 ENCOUNTER — Other Ambulatory Visit: Payer: Self-pay

## 2020-12-07 VITALS — Ht 59.0 in | Wt 170.0 lb

## 2020-12-07 DIAGNOSIS — M722 Plantar fascial fibromatosis: Secondary | ICD-10-CM

## 2020-12-08 NOTE — Progress Notes (Signed)
Patient ID: Julia Hughes, female   DOB: 05/27/1965, 55 y.o.   MRN: [id number]  Patient presents today for new custom orthotics.  She has a history of Planter fasciitis and her current orthotics were made in 2018.  She denies any foot pain today.  She is simply hoping to avoid getting Planter fasciitis again.  Custom orthotics were created today.  She found them to be comfortable prior to leaving the office.  Gait was neutral with orthotics in place.  Follow-up as needed.  Patient was fitted for a : standard, cushioned, semi-rigid orthotic. The orthotic was heated and afterward the patient stood on the orthotic blank positioned on the orthotic stand. The patient was positioned in subtalar neutral position and 10 degrees of ankle dorsiflexion in a weight bearing stance. After completion of molding, a stable base was applied to the orthotic blank. The blank was ground to a stable position for weight bearing. Size: 7 Base: Blue EVA Posting: none Additional orthotic padding: none

## 2020-12-09 ENCOUNTER — Other Ambulatory Visit (HOSPITAL_COMMUNITY): Payer: Self-pay

## 2020-12-09 DIAGNOSIS — E559 Vitamin D deficiency, unspecified: Secondary | ICD-10-CM | POA: Diagnosis not present

## 2020-12-09 DIAGNOSIS — E119 Type 2 diabetes mellitus without complications: Secondary | ICD-10-CM | POA: Diagnosis not present

## 2020-12-09 DIAGNOSIS — R7301 Impaired fasting glucose: Secondary | ICD-10-CM | POA: Diagnosis not present

## 2020-12-09 DIAGNOSIS — E7800 Pure hypercholesterolemia, unspecified: Secondary | ICD-10-CM | POA: Diagnosis not present

## 2020-12-09 DIAGNOSIS — E890 Postprocedural hypothyroidism: Secondary | ICD-10-CM | POA: Diagnosis not present

## 2020-12-09 MED ORDER — FREESTYLE LANCETS MISC
6 refills | Status: DC
  Filled 2020-12-09 (×2): qty 100, 90d supply, fill #0
  Filled 2021-02-28 – 2021-10-03 (×2): qty 100, 90d supply, fill #1

## 2020-12-09 MED ORDER — FREESTYLE LITE W/DEVICE KIT
PACK | 0 refills | Status: DC
  Filled 2020-12-09: qty 1, 1d supply, fill #0

## 2020-12-09 MED ORDER — METFORMIN HCL ER 500 MG PO TB24
500.0000 mg | ORAL_TABLET | Freq: Every day | ORAL | 6 refills | Status: DC
  Filled 2020-12-09: qty 30, 30d supply, fill #0
  Filled 2021-01-18: qty 30, 30d supply, fill #1

## 2020-12-09 MED ORDER — GLUCOSE BLOOD VI STRP
ORAL_STRIP | 6 refills | Status: DC
  Filled 2020-12-09 (×2): qty 50, 50d supply, fill #0
  Filled 2021-02-28: qty 50, 50d supply, fill #1
  Filled 2021-10-03: qty 50, 50d supply, fill #2

## 2020-12-13 ENCOUNTER — Other Ambulatory Visit (HOSPITAL_COMMUNITY): Payer: Self-pay

## 2020-12-27 ENCOUNTER — Other Ambulatory Visit (HOSPITAL_COMMUNITY): Payer: Self-pay

## 2020-12-27 MED FILL — Thyroid Tab 90 MG (1 1/2 Grain): ORAL | 90 days supply | Qty: 90 | Fill #1 | Status: AC

## 2021-01-18 ENCOUNTER — Other Ambulatory Visit (HOSPITAL_COMMUNITY): Payer: Self-pay

## 2021-02-14 ENCOUNTER — Other Ambulatory Visit (HOSPITAL_COMMUNITY): Payer: Self-pay

## 2021-02-14 DIAGNOSIS — Z3202 Encounter for pregnancy test, result negative: Secondary | ICD-10-CM | POA: Diagnosis not present

## 2021-02-14 DIAGNOSIS — Z1231 Encounter for screening mammogram for malignant neoplasm of breast: Secondary | ICD-10-CM | POA: Diagnosis not present

## 2021-02-14 DIAGNOSIS — N393 Stress incontinence (female) (male): Secondary | ICD-10-CM | POA: Diagnosis not present

## 2021-02-14 DIAGNOSIS — Z01419 Encounter for gynecological examination (general) (routine) without abnormal findings: Secondary | ICD-10-CM | POA: Diagnosis not present

## 2021-02-14 DIAGNOSIS — N952 Postmenopausal atrophic vaginitis: Secondary | ICD-10-CM | POA: Diagnosis not present

## 2021-02-14 MED ORDER — ESTRADIOL 0.1 MG/GM VA CREA
TOPICAL_CREAM | VAGINAL | 6 refills | Status: AC
  Filled 2021-02-14: qty 42.5, 90d supply, fill #0

## 2021-02-14 MED ORDER — VALACYCLOVIR HCL 1 G PO TABS
2000.0000 mg | ORAL_TABLET | Freq: Two times a day (BID) | ORAL | 1 refills | Status: DC
  Filled 2021-02-14: qty 28, 7d supply, fill #0
  Filled 2021-07-06: qty 28, 7d supply, fill #1

## 2021-02-16 ENCOUNTER — Other Ambulatory Visit: Payer: Self-pay | Admitting: Obstetrics

## 2021-02-16 DIAGNOSIS — R928 Other abnormal and inconclusive findings on diagnostic imaging of breast: Secondary | ICD-10-CM

## 2021-02-28 ENCOUNTER — Other Ambulatory Visit (HOSPITAL_COMMUNITY): Payer: Self-pay

## 2021-03-02 ENCOUNTER — Other Ambulatory Visit (HOSPITAL_COMMUNITY): Payer: Self-pay

## 2021-03-14 ENCOUNTER — Ambulatory Visit
Admission: RE | Admit: 2021-03-14 | Discharge: 2021-03-14 | Disposition: A | Discharge status: Home / Self Care | Payer: 59 | Source: Ambulatory Visit | Attending: Obstetrics | Admitting: Obstetrics

## 2021-03-14 ENCOUNTER — Ambulatory Visit: Payer: 59

## 2021-03-14 DIAGNOSIS — R928 Other abnormal and inconclusive findings on diagnostic imaging of breast: Secondary | ICD-10-CM

## 2021-03-17 DIAGNOSIS — R7303 Prediabetes: Secondary | ICD-10-CM | POA: Diagnosis not present

## 2021-03-17 DIAGNOSIS — Z0000 Encounter for general adult medical examination without abnormal findings: Secondary | ICD-10-CM | POA: Diagnosis not present

## 2021-03-17 DIAGNOSIS — Z13220 Encounter for screening for lipoid disorders: Secondary | ICD-10-CM | POA: Diagnosis not present

## 2021-03-17 DIAGNOSIS — R300 Dysuria: Secondary | ICD-10-CM | POA: Diagnosis not present

## 2021-03-17 DIAGNOSIS — E559 Vitamin D deficiency, unspecified: Secondary | ICD-10-CM | POA: Diagnosis not present

## 2021-04-04 ENCOUNTER — Other Ambulatory Visit (HOSPITAL_COMMUNITY): Payer: Self-pay

## 2021-04-05 ENCOUNTER — Other Ambulatory Visit (HOSPITAL_COMMUNITY): Payer: Self-pay

## 2021-04-05 MED ORDER — ARMOUR THYROID 90 MG PO TABS
90.0000 mg | ORAL_TABLET | Freq: Every day | ORAL | 4 refills | Status: DC
  Filled 2021-04-05: qty 78, 90d supply, fill #0
  Filled 2021-07-06: qty 78, 90d supply, fill #1
  Filled 2021-10-03: qty 78, 90d supply, fill #2
  Filled 2022-01-04: qty 78, 90d supply, fill #3
  Filled 2022-04-05: qty 78, 90d supply, fill #4

## 2021-04-13 ENCOUNTER — Other Ambulatory Visit (HOSPITAL_COMMUNITY): Payer: Self-pay

## 2021-07-06 ENCOUNTER — Other Ambulatory Visit (HOSPITAL_COMMUNITY): Payer: Self-pay

## 2021-07-25 DIAGNOSIS — E7800 Pure hypercholesterolemia, unspecified: Secondary | ICD-10-CM | POA: Diagnosis not present

## 2021-07-25 DIAGNOSIS — R7301 Impaired fasting glucose: Secondary | ICD-10-CM | POA: Diagnosis not present

## 2021-07-25 DIAGNOSIS — E119 Type 2 diabetes mellitus without complications: Secondary | ICD-10-CM | POA: Diagnosis not present

## 2021-07-25 DIAGNOSIS — E559 Vitamin D deficiency, unspecified: Secondary | ICD-10-CM | POA: Diagnosis not present

## 2021-07-25 DIAGNOSIS — E890 Postprocedural hypothyroidism: Secondary | ICD-10-CM | POA: Diagnosis not present

## 2021-09-23 ENCOUNTER — Ambulatory Visit (INDEPENDENT_AMBULATORY_CARE_PROVIDER_SITE_OTHER): Payer: 59 | Admitting: Sports Medicine

## 2021-09-23 ENCOUNTER — Other Ambulatory Visit (HOSPITAL_COMMUNITY): Payer: Self-pay

## 2021-09-23 VITALS — BP 142/88 | Ht 60.0 in | Wt 170.0 lb

## 2021-09-23 DIAGNOSIS — M25561 Pain in right knee: Secondary | ICD-10-CM | POA: Insufficient documentation

## 2021-09-23 MED ORDER — METHYLPREDNISOLONE ACETATE 40 MG/ML IJ SUSP
40.0000 mg | Freq: Once | INTRAMUSCULAR | Status: AC
  Administered 2021-09-23: 40 mg via INTRA_ARTICULAR

## 2021-09-23 MED ORDER — NAPROXEN 500 MG PO TABS
500.0000 mg | ORAL_TABLET | Freq: Two times a day (BID) | ORAL | 0 refills | Status: DC | PRN
  Filled 2021-09-23: qty 60, 30d supply, fill #0

## 2021-10-03 ENCOUNTER — Telehealth: Payer: Self-pay | Admitting: Sports Medicine

## 2021-10-03 ENCOUNTER — Other Ambulatory Visit (HOSPITAL_COMMUNITY): Payer: Self-pay

## 2021-10-03 ENCOUNTER — Encounter: Payer: Self-pay | Admitting: Sports Medicine

## 2021-10-03 MED ORDER — FREESTYLE LANCETS MISC
1 refills | Status: DC
  Filled 2021-10-03: qty 100, 90d supply, fill #0

## 2021-10-06 ENCOUNTER — Ambulatory Visit (INDEPENDENT_AMBULATORY_CARE_PROVIDER_SITE_OTHER): Payer: 59 | Admitting: Sports Medicine

## 2021-10-06 VITALS — BP 146/73 | Ht 60.0 in | Wt 167.0 lb

## 2021-10-06 DIAGNOSIS — M25561 Pain in right knee: Secondary | ICD-10-CM | POA: Diagnosis not present

## 2021-10-06 NOTE — Progress Notes (Signed)
   Subjective:    Patient ID: Julia Hughes, female    DOB: Dec 15, 1965, 56 y.o.   MRN: [id number]  HPI  Julia Hughes presents today for follow-up on right knee pain.  She is still having lateral knee pain with activity.  Some intermittent swelling as well.  Naproxen sodium has been somewhat helpful but 500 mg was causing some GI upset.  She has since limited it to 250 mg as needed.  She has been out of work since her last office visit.  Symptoms all began with a twisting injury of the right knee.    Review of Systems As above    Objective:   Physical Exam  Well-developed, well-nourished.  No acute distress  Right knee: Good range of motion.  Trace effusion.  She is tender to palpation along the lateral joint line with a positive Thessaly's.  No tenderness along the medial joint line.  Knee is stable to valgus and varus stressing.  Negative anterior drawer, negative posterior drawer.  Neurovascularly intact distally.      Assessment & Plan:   Persistent right knee pain worrisome for lateral meniscal tear  MRI of the right knee specifically to rule out a lateral meniscal tear.  Phone follow-up with those results when available and we will delineate a more definitive treatment plan based on those findings.  In the meantime, she will remain out of work (she works as an Warden/ranger) and I will complete her FMLA paperwork for her.  This note was dictated using Dragon naturally speaking software and may contain errors in syntax, spelling, or content which have not been identified prior to signing this note.

## 2021-10-09 ENCOUNTER — Ambulatory Visit
Admission: RE | Admit: 2021-10-09 | Discharge: 2021-10-09 | Disposition: A | Discharge status: Home / Self Care | Payer: 59 | Source: Ambulatory Visit | Attending: Sports Medicine | Admitting: Sports Medicine

## 2021-10-09 DIAGNOSIS — S83271A Complex tear of lateral meniscus, current injury, right knee, initial encounter: Secondary | ICD-10-CM | POA: Diagnosis not present

## 2021-10-09 DIAGNOSIS — M25561 Pain in right knee: Secondary | ICD-10-CM

## 2021-10-11 ENCOUNTER — Telehealth: Payer: Self-pay | Admitting: Sports Medicine

## 2021-10-11 ENCOUNTER — Other Ambulatory Visit: Payer: 59

## 2021-10-11 NOTE — Telephone Encounter (Signed)
  I spoke with Julia Hughes on the phone today after reviewing MRI findings of her right knee.  She has a complex tear of the lateral meniscus with potential he displaced meniscus tissue.  Mild to moderate degenerative changes throughout the rest of the knee.  Based on these findings I recommended surgical consultation with Dr. Alvan Dame to discuss merits of arthroscopy.  She will remain out of work until released by Dr. Alvan Dame.  Follow-up with me as needed.  This note was dictated using Dragon naturally speaking software and may contain errors in syntax, spelling, or content which have not been identified prior to signing this note.

## 2021-10-11 NOTE — Telephone Encounter (Deleted)
  MRI of the left knee reviewed.  Patient notified of the results.  There is a radial tear to the root of the posterior horn of the medial meniscus.  I recommended surgical consultation with Dr. Griffin Basil to discuss treatment options.  I will defer further work-up and treatment to his discretion and the patient will follow-up with me as needed.  This note was dictated using Dragon naturally speaking software and may contain errors in syntax, spelling, or content which have not been identified prior to signing this note.

## 2021-10-17 DIAGNOSIS — S83271A Complex tear of lateral meniscus, current injury, right knee, initial encounter: Secondary | ICD-10-CM | POA: Diagnosis not present

## 2021-10-17 DIAGNOSIS — M25561 Pain in right knee: Secondary | ICD-10-CM | POA: Diagnosis not present

## 2021-10-26 ENCOUNTER — Other Ambulatory Visit (HOSPITAL_COMMUNITY): Payer: Self-pay

## 2021-10-26 MED ORDER — ASPIRIN 325 MG PO TABS
325.0000 mg | ORAL_TABLET | Freq: Every day | ORAL | 0 refills | Status: DC
  Filled 2021-10-26: qty 30, 30d supply, fill #0

## 2021-10-26 MED ORDER — MELOXICAM 15 MG PO TABS
15.0000 mg | ORAL_TABLET | ORAL | 1 refills | Status: DC
  Filled 2021-10-26: qty 35, 30d supply, fill #0
  Filled 2021-10-31: qty 60, 60d supply, fill #0
  Filled 2022-01-04: qty 60, 60d supply, fill #1

## 2021-10-26 MED ORDER — TRAMADOL HCL 50 MG PO TABS
50.0000 mg | ORAL_TABLET | Freq: Four times a day (QID) | ORAL | 0 refills | Status: DC
  Filled 2021-10-26: qty 30, 8d supply, fill #0

## 2021-10-27 ENCOUNTER — Other Ambulatory Visit (HOSPITAL_COMMUNITY): Payer: Self-pay

## 2021-10-27 DIAGNOSIS — S83271A Complex tear of lateral meniscus, current injury, right knee, initial encounter: Secondary | ICD-10-CM | POA: Diagnosis not present

## 2021-10-27 DIAGNOSIS — G8918 Other acute postprocedural pain: Secondary | ICD-10-CM | POA: Diagnosis not present

## 2021-10-27 DIAGNOSIS — M948X6 Other specified disorders of cartilage, lower leg: Secondary | ICD-10-CM | POA: Diagnosis not present

## 2021-10-28 ENCOUNTER — Other Ambulatory Visit (HOSPITAL_COMMUNITY): Payer: Self-pay

## 2021-10-31 ENCOUNTER — Other Ambulatory Visit (HOSPITAL_COMMUNITY): Payer: Self-pay

## 2021-11-10 DIAGNOSIS — S0572XD Avulsion of left eye, subsequent encounter: Secondary | ICD-10-CM | POA: Diagnosis not present

## 2021-11-16 DIAGNOSIS — M25561 Pain in right knee: Secondary | ICD-10-CM | POA: Diagnosis not present

## 2021-11-16 DIAGNOSIS — M25661 Stiffness of right knee, not elsewhere classified: Secondary | ICD-10-CM | POA: Diagnosis not present

## 2021-11-24 DIAGNOSIS — M25561 Pain in right knee: Secondary | ICD-10-CM | POA: Diagnosis not present

## 2021-11-24 DIAGNOSIS — M25661 Stiffness of right knee, not elsewhere classified: Secondary | ICD-10-CM | POA: Diagnosis not present

## 2021-11-29 DIAGNOSIS — M25661 Stiffness of right knee, not elsewhere classified: Secondary | ICD-10-CM | POA: Diagnosis not present

## 2021-11-29 DIAGNOSIS — M25561 Pain in right knee: Secondary | ICD-10-CM | POA: Diagnosis not present

## 2021-12-01 DIAGNOSIS — M25561 Pain in right knee: Secondary | ICD-10-CM | POA: Diagnosis not present

## 2021-12-01 DIAGNOSIS — M25661 Stiffness of right knee, not elsewhere classified: Secondary | ICD-10-CM | POA: Diagnosis not present

## 2021-12-06 DIAGNOSIS — M25561 Pain in right knee: Secondary | ICD-10-CM | POA: Diagnosis not present

## 2021-12-06 DIAGNOSIS — M25661 Stiffness of right knee, not elsewhere classified: Secondary | ICD-10-CM | POA: Diagnosis not present

## 2021-12-08 DIAGNOSIS — M25661 Stiffness of right knee, not elsewhere classified: Secondary | ICD-10-CM | POA: Diagnosis not present

## 2021-12-08 DIAGNOSIS — M25561 Pain in right knee: Secondary | ICD-10-CM | POA: Diagnosis not present

## 2021-12-13 DIAGNOSIS — M25661 Stiffness of right knee, not elsewhere classified: Secondary | ICD-10-CM | POA: Diagnosis not present

## 2021-12-13 DIAGNOSIS — M25561 Pain in right knee: Secondary | ICD-10-CM | POA: Diagnosis not present

## 2021-12-14 DIAGNOSIS — Z4789 Encounter for other orthopedic aftercare: Secondary | ICD-10-CM | POA: Diagnosis not present

## 2021-12-15 DIAGNOSIS — M25661 Stiffness of right knee, not elsewhere classified: Secondary | ICD-10-CM | POA: Diagnosis not present

## 2021-12-15 DIAGNOSIS — M25561 Pain in right knee: Secondary | ICD-10-CM | POA: Diagnosis not present

## 2021-12-22 DIAGNOSIS — M25661 Stiffness of right knee, not elsewhere classified: Secondary | ICD-10-CM | POA: Diagnosis not present

## 2021-12-22 DIAGNOSIS — M25561 Pain in right knee: Secondary | ICD-10-CM | POA: Diagnosis not present

## 2021-12-27 DIAGNOSIS — M25661 Stiffness of right knee, not elsewhere classified: Secondary | ICD-10-CM | POA: Diagnosis not present

## 2021-12-27 DIAGNOSIS — M25561 Pain in right knee: Secondary | ICD-10-CM | POA: Diagnosis not present

## 2021-12-29 DIAGNOSIS — M25661 Stiffness of right knee, not elsewhere classified: Secondary | ICD-10-CM | POA: Diagnosis not present

## 2021-12-29 DIAGNOSIS — M25561 Pain in right knee: Secondary | ICD-10-CM | POA: Diagnosis not present

## 2022-01-04 ENCOUNTER — Other Ambulatory Visit (HOSPITAL_COMMUNITY): Payer: Self-pay

## 2022-01-16 DIAGNOSIS — M25661 Stiffness of right knee, not elsewhere classified: Secondary | ICD-10-CM | POA: Diagnosis not present

## 2022-01-16 DIAGNOSIS — M25561 Pain in right knee: Secondary | ICD-10-CM | POA: Diagnosis not present

## 2022-01-18 DIAGNOSIS — M25561 Pain in right knee: Secondary | ICD-10-CM | POA: Diagnosis not present

## 2022-01-18 DIAGNOSIS — M25661 Stiffness of right knee, not elsewhere classified: Secondary | ICD-10-CM | POA: Diagnosis not present

## 2022-01-23 DIAGNOSIS — M25661 Stiffness of right knee, not elsewhere classified: Secondary | ICD-10-CM | POA: Diagnosis not present

## 2022-01-23 DIAGNOSIS — M25561 Pain in right knee: Secondary | ICD-10-CM | POA: Diagnosis not present

## 2022-01-26 ENCOUNTER — Other Ambulatory Visit (HOSPITAL_BASED_OUTPATIENT_CLINIC_OR_DEPARTMENT_OTHER): Payer: Self-pay

## 2022-01-26 DIAGNOSIS — M25661 Stiffness of right knee, not elsewhere classified: Secondary | ICD-10-CM | POA: Diagnosis not present

## 2022-01-26 DIAGNOSIS — M25561 Pain in right knee: Secondary | ICD-10-CM | POA: Diagnosis not present

## 2022-01-26 MED ORDER — ZOSTER VAC RECOMB ADJUVANTED 50 MCG/0.5ML IM SUSR
INTRAMUSCULAR | 0 refills | Status: DC
  Filled 2022-01-26: qty 0.5, 1d supply, fill #0

## 2022-01-27 DIAGNOSIS — H524 Presbyopia: Secondary | ICD-10-CM | POA: Diagnosis not present

## 2022-01-27 DIAGNOSIS — Z970 Presence of artificial eye: Secondary | ICD-10-CM | POA: Diagnosis not present

## 2022-01-27 DIAGNOSIS — H2511 Age-related nuclear cataract, right eye: Secondary | ICD-10-CM | POA: Diagnosis not present

## 2022-01-30 DIAGNOSIS — M25661 Stiffness of right knee, not elsewhere classified: Secondary | ICD-10-CM | POA: Diagnosis not present

## 2022-01-30 DIAGNOSIS — M25561 Pain in right knee: Secondary | ICD-10-CM | POA: Diagnosis not present

## 2022-02-16 ENCOUNTER — Other Ambulatory Visit (HOSPITAL_COMMUNITY): Payer: Self-pay

## 2022-02-16 DIAGNOSIS — Z1231 Encounter for screening mammogram for malignant neoplasm of breast: Secondary | ICD-10-CM | POA: Diagnosis not present

## 2022-02-16 DIAGNOSIS — Z01419 Encounter for gynecological examination (general) (routine) without abnormal findings: Secondary | ICD-10-CM | POA: Diagnosis not present

## 2022-02-16 MED ORDER — VALACYCLOVIR HCL 1 G PO TABS
ORAL_TABLET | ORAL | 0 refills | Status: DC
  Filled 2022-02-16: qty 28, 14d supply, fill #0

## 2022-02-17 ENCOUNTER — Other Ambulatory Visit (HOSPITAL_COMMUNITY): Payer: Self-pay

## 2022-03-06 ENCOUNTER — Other Ambulatory Visit (HOSPITAL_COMMUNITY): Payer: Self-pay

## 2022-03-06 MED ORDER — ESTRADIOL 0.1 MG/GM VA CREA
1.0000 g | TOPICAL_CREAM | Freq: Every day | VAGINAL | 6 refills | Status: AC
  Filled 2022-03-06: qty 42.5, 90d supply, fill #0

## 2022-03-07 ENCOUNTER — Other Ambulatory Visit (HOSPITAL_COMMUNITY): Payer: Self-pay

## 2022-03-29 ENCOUNTER — Other Ambulatory Visit (HOSPITAL_BASED_OUTPATIENT_CLINIC_OR_DEPARTMENT_OTHER): Payer: Self-pay

## 2022-03-29 DIAGNOSIS — R300 Dysuria: Secondary | ICD-10-CM | POA: Diagnosis not present

## 2022-03-29 MED ORDER — ZOSTER VAC RECOMB ADJUVANTED 50 MCG/0.5ML IM SUSR
INTRAMUSCULAR | 0 refills | Status: AC
  Filled 2022-03-29 – 2022-06-19 (×2): qty 0.5, 1d supply, fill #0

## 2022-04-06 ENCOUNTER — Other Ambulatory Visit (HOSPITAL_COMMUNITY): Payer: Self-pay

## 2022-04-06 MED ORDER — THYROID 90 MG PO TABS
90.0000 mg | ORAL_TABLET | ORAL | 4 refills | Status: AC
  Filled 2022-04-06: qty 78, 84d supply, fill #0
  Filled 2022-07-05: qty 78, 84d supply, fill #1
  Filled 2022-09-25: qty 78, 84d supply, fill #2

## 2022-04-07 ENCOUNTER — Other Ambulatory Visit (HOSPITAL_COMMUNITY): Payer: Self-pay

## 2022-05-15 DIAGNOSIS — H9313 Tinnitus, bilateral: Secondary | ICD-10-CM | POA: Diagnosis not present

## 2022-05-15 DIAGNOSIS — H903 Sensorineural hearing loss, bilateral: Secondary | ICD-10-CM | POA: Diagnosis not present

## 2022-06-19 ENCOUNTER — Other Ambulatory Visit (HOSPITAL_BASED_OUTPATIENT_CLINIC_OR_DEPARTMENT_OTHER): Payer: Self-pay

## 2022-07-03 ENCOUNTER — Other Ambulatory Visit (HOSPITAL_BASED_OUTPATIENT_CLINIC_OR_DEPARTMENT_OTHER): Payer: Self-pay

## 2022-07-05 ENCOUNTER — Other Ambulatory Visit: Payer: Self-pay

## 2022-07-18 DIAGNOSIS — E559 Vitamin D deficiency, unspecified: Secondary | ICD-10-CM | POA: Diagnosis not present

## 2022-07-18 DIAGNOSIS — R946 Abnormal results of thyroid function studies: Secondary | ICD-10-CM | POA: Diagnosis not present

## 2022-07-18 DIAGNOSIS — R7303 Prediabetes: Secondary | ICD-10-CM | POA: Diagnosis not present

## 2022-07-18 DIAGNOSIS — R7989 Other specified abnormal findings of blood chemistry: Secondary | ICD-10-CM | POA: Diagnosis not present

## 2022-07-18 DIAGNOSIS — Z1321 Encounter for screening for nutritional disorder: Secondary | ICD-10-CM | POA: Diagnosis not present

## 2022-07-18 DIAGNOSIS — E7800 Pure hypercholesterolemia, unspecified: Secondary | ICD-10-CM | POA: Diagnosis not present

## 2022-07-25 DIAGNOSIS — E559 Vitamin D deficiency, unspecified: Secondary | ICD-10-CM | POA: Diagnosis not present

## 2022-07-25 DIAGNOSIS — R7303 Prediabetes: Secondary | ICD-10-CM | POA: Diagnosis not present

## 2022-07-25 DIAGNOSIS — E7800 Pure hypercholesterolemia, unspecified: Secondary | ICD-10-CM | POA: Diagnosis not present

## 2022-07-25 DIAGNOSIS — Z23 Encounter for immunization: Secondary | ICD-10-CM | POA: Diagnosis not present

## 2022-07-25 DIAGNOSIS — Z0000 Encounter for general adult medical examination without abnormal findings: Secondary | ICD-10-CM | POA: Diagnosis not present

## 2022-07-25 DIAGNOSIS — E039 Hypothyroidism, unspecified: Secondary | ICD-10-CM | POA: Diagnosis not present

## 2022-09-23 DIAGNOSIS — S42001A Fracture of unspecified part of right clavicle, initial encounter for closed fracture: Secondary | ICD-10-CM | POA: Diagnosis not present

## 2022-09-23 DIAGNOSIS — S52591A Other fractures of lower end of right radius, initial encounter for closed fracture: Secondary | ICD-10-CM | POA: Diagnosis not present

## 2022-09-23 DIAGNOSIS — E785 Hyperlipidemia, unspecified: Secondary | ICD-10-CM | POA: Diagnosis not present

## 2022-09-25 ENCOUNTER — Other Ambulatory Visit (HOSPITAL_COMMUNITY): Payer: Self-pay

## 2022-09-26 DIAGNOSIS — M25531 Pain in right wrist: Secondary | ICD-10-CM | POA: Diagnosis not present

## 2022-09-26 DIAGNOSIS — S42001A Fracture of unspecified part of right clavicle, initial encounter for closed fracture: Secondary | ICD-10-CM | POA: Diagnosis not present

## 2022-09-27 DIAGNOSIS — S42001D Fracture of unspecified part of right clavicle, subsequent encounter for fracture with routine healing: Secondary | ICD-10-CM | POA: Diagnosis not present

## 2022-09-27 NOTE — Patient Instructions (Addendum)
DUE TO COVID-19 ONLY TWO VISITORS  (aged 58 and older)  ARE ALLOWED TO COME WITH YOU AND STAY IN THE WAITING ROOM ONLY DURING PRE OP AND PROCEDURE.   **NO VISITORS ARE ALLOWED IN THE SHORT STAY AREA OR RECOVERY ROOM!!**  IF YOU WILL BE ADMITTED INTO THE HOSPITAL YOU ARE ALLOWED ONLY FOUR SUPPORT PEOPLE DURING VISITATION HOURS ONLY (7 AM -8PM)   The support person(s) must pass our screening, gel in and out, and wear a mask at all times, including in the patient's room. Patients must also wear a mask when staff or their support person are in the room. Visitors GUEST BADGE MUST BE WORN VISIBLY  One adult visitor may remain with you overnight and MUST be in the room by 8 P.M.     Your procedure is scheduled on: 09/29/22   Report to [facility] Main Entrance    Report to admitting at : 5:15 AM   Call this number if you have problems the morning of surgery [phone]   Do not eat food :After Midnight.   After Midnight you may have the following liquids until : 4:15 AM DAY OF SURGERY  Water Black Coffee (sugar ok, NO MILK/CREAM OR CREAMERS)  Tea (sugar ok, NO MILK/CREAM OR CREAMERS) regular and decaf                             Plain Jell-O (NO RED)                                           Fruit ices (not with fruit pulp, NO RED)                                     Popsicles (NO RED)                                                                  Juice: apple, WHITE grape, WHITE cranberry Sports drinks like Gatorade (NO RED)    The day of surgery:  Drink ONE (1) Pre-Surgery Clear G2 at : 4:15 AM the morning of surgery. Drink in one sitting. Do not sip.  This drink was given to you during your hospital  pre-op appointment visit. Nothing else to drink after completing the  Pre-Surgery Clear Ensure or G2.          If you have questions, please contact your surgeon's office.   Oral Hygiene is also important to reduce your risk of infection.                                     Remember - BRUSH YOUR TEETH THE MORNING OF SURGERY WITH YOUR REGULAR TOOTHPASTE  DENTURES WILL BE REMOVED PRIOR TO SURGERY PLEASE DO NOT APPLY "Poly grip" OR ADHESIVES!!!   Do NOT smoke after Midnight   Take these medicines the morning of surgery with A SIP OF WATER: thyroid.Ultram as needed.  DO NOT TAKE  ANY ORAL DIABETIC MEDICATIONS DAY OF YOUR SURGERY                              You may not have any metal on your body including hair pins, jewelry, and body piercing             Do not wear make-up, lotions, powders, perfumes/cologne, or deodorant  Do not wear nail polish including gel and S&S, artificial/acrylic nails, or any other type of covering on natural nails including finger and toenails. If you have artificial nails, gel coating, etc. that needs to be removed by a nail salon please have this removed prior to surgery or surgery may need to be canceled/ delayed if the surgeon/ anesthesia feels like they are unable to be safely monitored.   Do not shave  48 hours prior to surgery.   Do not bring valuables to the hospital. [facility] IS NOT             RESPONSIBLE   FOR VALUABLES.   Contacts, glasses, or bridgework may not be worn into surgery.   Bring small overnight bag day of surgery.   DO NOT BRING YOUR HOME MEDICATIONS TO THE HOSPITAL. PHARMACY WILL DISPENSE MEDICATIONS LISTED ON YOUR MEDICATION LIST TO YOU DURING YOUR ADMISSION IN THE HOSPITAL!    Patients discharged on the day of surgery will not be allowed to drive home.  Someone NEEDS to stay with you for the first 24 hours after anesthesia.   Special Instructions: Bring a copy of your healthcare power of attorney and living will documents         the day of surgery if you haven't scanned them before.              Please read over the following fact sheets you were given: IF YOU HAVE QUESTIONS ABOUT YOUR PRE-OP INSTRUCTIONS PLEASE CALL [phone]    [facility] - Preparing for Surgery Before surgery, you can  play an important role.  Because skin is not sterile, your skin needs to be as free of germs as possible.  You can reduce the number of germs on your skin by washing with CHG (chlorahexidine gluconate) soap before surgery.  CHG is an antiseptic cleaner which kills germs and bonds with the skin to continue killing germs even after washing. Please DO NOT use if you have an allergy to CHG or antibacterial soaps.  If your skin becomes reddened/irritated stop using the CHG and inform your nurse when you arrive at Short Stay. Do not shave (including legs and underarms) for at least 48 hours prior to the first CHG shower.  You may shave your face/neck. Please follow these instructions carefully:  1.  Shower with CHG Soap the night before surgery and the  morning of Surgery.  2.  If you choose to wash your hair, wash your hair first as usual with your  normal  shampoo.  3.  After you shampoo, rinse your hair and body thoroughly to remove the  shampoo.                           4.  Use CHG as you would any other liquid soap.  You can apply chg directly  to the skin and wash  Gently with a scrungie or clean washcloth.  5.  Apply the CHG Soap to your body ONLY FROM THE NECK DOWN.   Do not use on face/ open                           Wound or open sores. Avoid contact with eyes, ears mouth and genitals (private parts).                       Wash face,  Genitals (private parts) with your normal soap.             6.  Wash thoroughly, paying special attention to the area where your surgery  will be performed.  7.  Thoroughly rinse your body with warm water from the neck down.  8.  DO NOT shower/wash with your normal soap after using and rinsing off  the CHG Soap.                9.  Pat yourself dry with a clean towel.            10.  Wear clean pajamas.            11.  Place clean sheets on your bed the night of your first shower and do not  sleep with pets. Day of Surgery : Do not apply any  lotions/deodorants the morning of surgery.  Please wear clean clothes to the hospital/surgery center.  FAILURE TO FOLLOW THESE INSTRUCTIONS MAY RESULT IN THE CANCELLATION OF YOUR SURGERY PATIENT SIGNATURE_________________________________  NURSE SIGNATURE__________________________________  ________________________________________________________________________  Rogelia Mire  An incentive spirometer is a tool that can help keep your lungs clear and active. This tool measures how well you are filling your lungs with each breath. Taking long deep breaths may help reverse or decrease the chance of developing breathing (pulmonary) problems (especially infection) following: A long period of time when you are unable to move or be active. BEFORE THE PROCEDURE  If the spirometer includes an indicator to show your best effort, your nurse or respiratory therapist will set it to a desired goal. If possible, sit up straight or lean slightly forward. Try not to slouch. Hold the incentive spirometer in an upright position. INSTRUCTIONS FOR USE  Sit on the edge of your bed if possible, or sit up as far as you can in bed or on a chair. Hold the incentive spirometer in an upright position. Breathe out normally. Place the mouthpiece in your mouth and seal your lips tightly around it. Breathe in slowly and as deeply as possible, raising the piston or the ball toward the top of the column. Hold your breath for 3-5 seconds or for as long as possible. Allow the piston or ball to fall to the bottom of the column. Remove the mouthpiece from your mouth and breathe out normally. Rest for a few seconds and repeat Steps 1 through 7 at least 10 times every 1-2 hours when you are awake. Take your time and take a few normal breaths between deep breaths. The spirometer may include an indicator to show your best effort. Use the indicator as a goal to work toward during each repetition. After each set of 10 deep  breaths, practice coughing to be sure your lungs are clear. If you have an incision (the cut made at the time of surgery), support your incision when coughing by placing a pillow or rolled up towels firmly  against it. Once you are able to get out of bed, walk around indoors and cough well. You may stop using the incentive spirometer when instructed by your caregiver.  RISKS AND COMPLICATIONS Take your time so you do not get dizzy or light-headed. If you are in pain, you may need to take or ask for pain medication before doing incentive spirometry. It is harder to take a deep breath if you are having pain. AFTER USE Rest and breathe slowly and easily. It can be helpful to keep track of a log of your progress. Your caregiver can provide you with a simple table to help with this. If you are using the spirometer at home, follow these instructions: [place] IF:  You are having difficultly using the spirometer. You have trouble using the spirometer as often as instructed. Your pain medication is not giving enough relief while using the spirometer. You develop fever of 100.5 F (38.1 C) or higher. SEEK IMMEDIATE MEDICAL CARE IF:  You cough up bloody sputum that had not been present before. You develop fever of 102 F (38.9 C) or greater. You develop worsening pain at or near the incision site. MAKE SURE YOU:  Understand these instructions. Will watch your condition. Will get help right away if you are not doing well or get worse. Document Released: 07/31/2006 Document Revised: 06/12/2011 Document Reviewed: 10/01/2006 [facility] Patient Information 2014 [place], [place].   ________________________________________________________________________

## 2022-09-28 ENCOUNTER — Other Ambulatory Visit: Payer: Self-pay

## 2022-09-28 ENCOUNTER — Encounter (HOSPITAL_COMMUNITY): Payer: Self-pay

## 2022-09-28 ENCOUNTER — Encounter (HOSPITAL_COMMUNITY)
Admission: RE | Admit: 2022-09-28 | Discharge: 2022-09-28 | Disposition: A | Discharge status: Home / Self Care | Payer: 59 | Source: Ambulatory Visit | Attending: Orthopedic Surgery | Admitting: Orthopedic Surgery

## 2022-09-28 VITALS — BP 139/81 | HR 79 | Temp 97.8°F | Ht 59.0 in | Wt 158.4 lb

## 2022-09-28 DIAGNOSIS — Z01818 Encounter for other preprocedural examination: Secondary | ICD-10-CM | POA: Diagnosis not present

## 2022-09-28 DIAGNOSIS — E119 Type 2 diabetes mellitus without complications: Secondary | ICD-10-CM | POA: Insufficient documentation

## 2022-09-28 HISTORY — DX: Cardiac murmur, unspecified: R01.1

## 2022-09-28 HISTORY — DX: Hypothyroidism, unspecified: E03.9

## 2022-09-28 HISTORY — DX: Type 2 diabetes mellitus without complications: E11.9

## 2022-09-28 LAB — GLUCOSE, CAPILLARY: Glucose-Capillary: 116 mg/dL — ABNORMAL HIGH (ref 70–99)

## 2022-09-28 NOTE — Anesthesia Preprocedure Evaluation (Addendum)
Anesthesia Evaluation  Patient identified by MRN, date of birth, ID band Patient awake    Reviewed: Allergy & Precautions, NPO status , Patient's Chart, lab work & pertinent test results  History of Anesthesia Complications Negative for: history of anesthetic complications  Airway Mallampati: III  TM Distance: >3 FB Neck ROM: Full    Dental  (+) Dental Advisory Given   Pulmonary neg pulmonary ROS   Pulmonary exam normal breath sounds clear to auscultation       Cardiovascular (-) hypertension(-) angina (-) Past MI, (-) Cardiac Stents and (-) CABG (-) dysrhythmias + Valvular Problems/Murmurs  Rhythm:Regular Rate:Normal     Neuro/Psych negative neurological ROS     GI/Hepatic negative GI ROS, Neg liver ROS,,,  Endo/Other  diabetes, Type 2Hypothyroidism    Renal/GU negative Renal ROS     Musculoskeletal   Abdominal   Peds  Hematology negative hematology ROS (+)   Anesthesia Other Findings   Reproductive/Obstetrics                             Anesthesia Physical Anesthesia Plan  ASA: 2  Anesthesia Plan: General   Post-op Pain Management: Tylenol PO (pre-op)* and Ketamine IV*   Induction: Intravenous  PONV Risk Score and Plan: 3 and Ondansetron, Dexamethasone and Treatment may vary due to age or medical condition  Airway Management Planned: Oral ETT  Additional Equipment:   Intra-op Plan:   Post-operative Plan: Extubation in OR  Informed Consent: I have reviewed the patients History and Physical, chart, labs and discussed the procedure including the risks, benefits and alternatives for the proposed anesthesia with the patient or authorized representative who has indicated his/her understanding and acceptance.     Dental advisory given  Plan Discussed with: Anesthesiologist and CRNA  Anesthesia Plan Comments: (See PAT note from 6/27 by Sherlie Ban PA-C  Risks of general  anesthesia discussed including, but not limited to, sore throat, hoarse voice, chipped/damaged teeth, injury to vocal cords, nausea and vomiting, allergic reactions, lung infection, heart attack, stroke, and death. All questions answered.  )        Anesthesia Quick Evaluation

## 2022-09-28 NOTE — Progress Notes (Addendum)
For Short Stay: COVID SWAB appointment date:  Bowel Prep reminder:   For Anesthesia: PCP - DO: [name]  Cardiologist - N/A  Chest x-ray - 02/20/23 EKG - 09/28/22 Stress Test -  ECHO -  Cardiac Cath -  Pacemaker/ICD device last checked: Pacemaker orders received: Device Rep notified:  Spinal Cord Stimulator: N/A  Sleep Study - N/A CPAP -   Fasting Blood Sugar -  Checks Blood Sugar _____ times a day Date and result of last Hgb A1c-  Last dose of GLP1 agonist- N/A GLP1 instructions:   Last dose of SGLT-2 inhibitors- N/A SGLT-2 instructions:   Blood Thinner Instructions: N/A Aspirin Instructions: Last Dose:  Activity level: Can go up a flight of stairs and activities of daily living without stopping and without chest pain and/or shortness of breath   Able to exercise without chest pain and/or shortness of breath    Anesthesia review: Heart murmur  Patient denies shortness of breath, fever, cough and chest pain at PAT appointment   Patient verbalized understanding of instructions that were given to them at the PAT appointment. Patient was also instructed that they will need to review over the PAT instructions again at home before surgery.

## 2022-09-28 NOTE — Progress Notes (Signed)
Case: [id number] Date/Time: 09/29/22 0700   Procedure: OPEN REDUCTION INTERNAL FIXATION (ORIF) CLAVICLE FRACTURE (Right) - 110   Anesthesia type: Choice   Pre-op diagnosis: Right midshaft clavicle fracture   Location: [facility] ROOM 09 / [facility]   Surgeons: Yolonda Kida, MD       DISCUSSION: Julia Hughes is a 57 yo female who presents to PAT prior to surgery listed above. PMH significant for hypothyroidism, prediabetes, hx of heart murmur. Patient fell on 09/23/22 and presented to the ED. She was found to have a R clavicle fx and is now scheduled for ORIF.   No prior anesthesia complications.  Patient follows with her PCP for chronic medical issues and endocrinology for her thyroid disease. Patient has been told 20+ years ago by her OBGYN that she had a heart murmur. She was referred to Cardiology (says she saw Dr. Jacinto Halim) and reportedly had an echo which was normal. She has not had any type of cardiac follow up since or repeat echo. Soft, 2/6 systolic murmur auscultated in the RUSB during PAT visit. Pt reports good functional status and denies CP/SOB, syncope.  Discussed with Dr. Hyacinth Meeker who states ok to proceed due to urgency of case.  VS: BP 139/81   Pulse 79   Temp 36.6 C (Oral)   Ht 4\' 11"  (1.499 m)   Wt 71.8 kg   SpO2 100%   BMI 31.99 kg/m   PROVIDERS: PCP: [name], DO   LABS: Labs reviewed: Acceptable for surgery. (all labs ordered are listed, but only abnormal results are displayed)  Labs Reviewed  GLUCOSE, CAPILLARY - Abnormal; Notable for the following components:      Result Value   Glucose-Capillary 116 (*)    All other components within normal limits  HEMOGLOBIN A1C     IMAGES: R shoulder xray 09/23/22:  FINDINGS:  The glenohumeral alignment is maintained.  The overlapping clavicular fracture is again seen.  The visualized thorax appears grossly unremarkable.   EKG 09/28/22:  NSR, rate 70   CV: n/a  Past Medical History:  Diagnosis Date    Diabetes mellitus without complication (HCC)    Heart murmur    Hypothyroidism     Past Surgical History:  Procedure Laterality Date   BREAST BIOPSY Right    over 20 years   BREAST CYST EXCISION Right    over 20 years   KNEE ARTHROSCOPY Right    TOTAL THYROIDECTOMY      MEDICATIONS:  acetaminophen (TYLENOL) 650 MG CR tablet   estradiol (ESTRACE) 0.1 MG/GM vaginal cream   estradiol (ESTRACE) 0.1 MG/GM vaginal cream   ibuprofen (ADVIL) 200 MG tablet   thyroid (ARMOUR THYROID) 90 MG tablet   thyroid (ARMOUR) 90 MG tablet   Zoster Vaccine Adjuvanted [facility]) injection   No current facility-administered medications for this encounter.   Marcille Blanco [facility] Short Stay/Anesthesiology [facility] Phone [phone] 09/28/2022 11:48 AM

## 2022-09-29 ENCOUNTER — Encounter (HOSPITAL_COMMUNITY)
Admission: RE | Disposition: A | Discharge status: Home / Self Care | Payer: Self-pay | Source: Home / Self Care | Attending: Orthopedic Surgery

## 2022-09-29 ENCOUNTER — Ambulatory Visit (HOSPITAL_COMMUNITY): Payer: 59 | Admitting: Medical

## 2022-09-29 ENCOUNTER — Other Ambulatory Visit: Payer: Self-pay

## 2022-09-29 ENCOUNTER — Ambulatory Visit (HOSPITAL_BASED_OUTPATIENT_CLINIC_OR_DEPARTMENT_OTHER): Payer: 59 | Admitting: Anesthesiology

## 2022-09-29 ENCOUNTER — Ambulatory Visit (HOSPITAL_COMMUNITY): Payer: 59

## 2022-09-29 ENCOUNTER — Ambulatory Visit (HOSPITAL_COMMUNITY)
Admission: RE | Admit: 2022-09-29 | Discharge: 2022-09-29 | Disposition: A | Discharge status: Home / Self Care | Payer: 59 | Attending: Orthopedic Surgery | Admitting: Orthopedic Surgery

## 2022-09-29 ENCOUNTER — Encounter (HOSPITAL_COMMUNITY): Payer: Self-pay | Admitting: Orthopedic Surgery

## 2022-09-29 ENCOUNTER — Other Ambulatory Visit (HOSPITAL_COMMUNITY): Payer: Self-pay

## 2022-09-29 DIAGNOSIS — E890 Postprocedural hypothyroidism: Secondary | ICD-10-CM | POA: Insufficient documentation

## 2022-09-29 DIAGNOSIS — S42021A Displaced fracture of shaft of right clavicle, initial encounter for closed fracture: Secondary | ICD-10-CM

## 2022-09-29 DIAGNOSIS — E119 Type 2 diabetes mellitus without complications: Secondary | ICD-10-CM | POA: Insufficient documentation

## 2022-09-29 DIAGNOSIS — W19XXXA Unspecified fall, initial encounter: Secondary | ICD-10-CM | POA: Insufficient documentation

## 2022-09-29 DIAGNOSIS — S42001A Fracture of unspecified part of right clavicle, initial encounter for closed fracture: Secondary | ICD-10-CM | POA: Diagnosis not present

## 2022-09-29 HISTORY — PX: ORIF CLAVICULAR FRACTURE: SHX5055

## 2022-09-29 LAB — HEMOGLOBIN A1C
Hgb A1c MFr Bld: 6.4 % — ABNORMAL HIGH (ref 4.8–5.6)
Mean Plasma Glucose: 137 mg/dL

## 2022-09-29 LAB — GLUCOSE, CAPILLARY
Glucose-Capillary: 121 mg/dL — ABNORMAL HIGH (ref 70–99)
Glucose-Capillary: 118 mg/dL — ABNORMAL HIGH (ref 70–99)

## 2022-09-29 SURGERY — OPEN REDUCTION INTERNAL FIXATION (ORIF) CLAVICULAR FRACTURE
Anesthesia: General | Laterality: Right

## 2022-09-29 MED ORDER — ONDANSETRON HCL 4 MG PO TABS
4.0000 mg | ORAL_TABLET | Freq: Three times a day (TID) | ORAL | 0 refills | Status: AC | PRN
  Filled 2022-09-29: qty 20, 7d supply, fill #0

## 2022-09-29 MED ORDER — BUPIVACAINE HCL (PF) 0.25 % IJ SOLN
INTRAMUSCULAR | Status: AC
  Filled 2022-09-29: qty 30

## 2022-09-29 MED ORDER — LIDOCAINE 2% (20 MG/ML) 5 ML SYRINGE
INTRAMUSCULAR | Status: DC | PRN
  Administered 2022-09-29: 100 mg via INTRAVENOUS

## 2022-09-29 MED ORDER — LACTATED RINGERS IV SOLN: INTRAVENOUS | Status: DC

## 2022-09-29 MED ORDER — FENTANYL CITRATE (PF) 100 MCG/2ML IJ SOLN
INTRAMUSCULAR | Status: AC
  Filled 2022-09-29: qty 2

## 2022-09-29 MED ORDER — MIDAZOLAM HCL 5 MG/5ML IJ SOLN: INTRAMUSCULAR | Status: DC | PRN

## 2022-09-29 MED ORDER — KETOROLAC TROMETHAMINE 30 MG/ML IJ SOLN
30.0000 mg | Freq: Once | INTRAMUSCULAR | Status: AC
  Administered 2022-09-29: 30 mg via INTRAVENOUS

## 2022-09-29 MED ORDER — FENTANYL CITRATE PF 50 MCG/ML IJ SOSY
25.0000 ug | PREFILLED_SYRINGE | INTRAMUSCULAR | Status: DC | PRN
  Administered 2022-09-29 (×2): 50 ug via INTRAVENOUS

## 2022-09-29 MED ORDER — VANCOMYCIN HCL 1000 MG IV SOLR
INTRAVENOUS | Status: AC
  Filled 2022-09-29: qty 20

## 2022-09-29 MED ORDER — OXYCODONE HCL 5 MG PO TABS: 5.0000 mg | ORAL_TABLET | Freq: Once | ORAL | Status: AC | PRN

## 2022-09-29 MED ORDER — ACETAMINOPHEN 500 MG PO TABS
1000.0000 mg | ORAL_TABLET | Freq: Once | ORAL | Status: AC
  Administered 2022-09-29: 1000 mg via ORAL
  Filled 2022-09-29: qty 2

## 2022-09-29 MED ORDER — MIDAZOLAM HCL 2 MG/2ML IJ SOLN
INTRAMUSCULAR | Status: AC
  Filled 2022-09-29: qty 2

## 2022-09-29 MED ORDER — CHLORHEXIDINE GLUCONATE 0.12 % MT SOLN
15.0000 mL | Freq: Once | OROMUCOSAL | Status: AC
  Administered 2022-09-29: 15 mL via OROMUCOSAL

## 2022-09-29 MED ORDER — ROCURONIUM BROMIDE 10 MG/ML (PF) SYRINGE
PREFILLED_SYRINGE | INTRAVENOUS | Status: AC
  Filled 2022-09-29: qty 10

## 2022-09-29 MED ORDER — VANCOMYCIN HCL 1000 MG IV SOLR
INTRAVENOUS | Status: DC | PRN
  Administered 2022-09-29: 1000 mg

## 2022-09-29 MED ORDER — CEFAZOLIN SODIUM-DEXTROSE 2-4 GM/100ML-% IV SOLN
2.0000 g | INTRAVENOUS | Status: AC
  Administered 2022-09-29: 2 g via INTRAVENOUS
  Filled 2022-09-29: qty 100

## 2022-09-29 MED ORDER — AMISULPRIDE (ANTIEMETIC) 5 MG/2ML IV SOLN: 10.0000 mg | Freq: Once | INTRAVENOUS | Status: DC | PRN

## 2022-09-29 MED ORDER — OXYCODONE HCL 5 MG PO TABS
ORAL_TABLET | ORAL | Status: AC
  Administered 2022-09-29: 5 mg via ORAL
  Filled 2022-09-29: qty 1

## 2022-09-29 MED ORDER — OXYCODONE HCL 5 MG PO TABS
5.0000 mg | ORAL_TABLET | Freq: Four times a day (QID) | ORAL | 0 refills | Status: AC | PRN
  Filled 2022-09-29: qty 20, 5d supply, fill #0

## 2022-09-29 MED ORDER — ONDANSETRON HCL 4 MG/2ML IJ SOLN
INTRAMUSCULAR | Status: AC
  Filled 2022-09-29: qty 2

## 2022-09-29 MED ORDER — 0.9 % SODIUM CHLORIDE (POUR BTL) OPTIME
TOPICAL | Status: DC | PRN
  Administered 2022-09-29: 1000 mL

## 2022-09-29 MED ORDER — ORAL CARE MOUTH RINSE: 15.0000 mL | Freq: Once | OROMUCOSAL | Status: AC

## 2022-09-29 MED ORDER — TRANEXAMIC ACID-NACL 1000-0.7 MG/100ML-% IV SOLN
1000.0000 mg | INTRAVENOUS | Status: AC
  Administered 2022-09-29: 1000 mg via INTRAVENOUS
  Filled 2022-09-29: qty 100

## 2022-09-29 MED ORDER — EPINEPHRINE PF 1 MG/ML IJ SOLN
INTRAMUSCULAR | Status: AC
  Filled 2022-09-29: qty 1

## 2022-09-29 MED ORDER — DEXAMETHASONE SODIUM PHOSPHATE 10 MG/ML IJ SOLN
INTRAMUSCULAR | Status: DC | PRN
  Administered 2022-09-29: 10 mg via INTRAVENOUS

## 2022-09-29 MED ORDER — SUGAMMADEX SODIUM 200 MG/2ML IV SOLN
INTRAVENOUS | Status: DC | PRN
  Administered 2022-09-29: 150 mg via INTRAVENOUS

## 2022-09-29 MED ORDER — FENTANYL CITRATE PF 50 MCG/ML IJ SOSY
PREFILLED_SYRINGE | INTRAMUSCULAR | Status: AC
  Administered 2022-09-29: 50 ug via INTRAVENOUS
  Filled 2022-09-29: qty 3

## 2022-09-29 MED ORDER — KETOROLAC TROMETHAMINE 30 MG/ML IJ SOLN
INTRAMUSCULAR | Status: AC
  Filled 2022-09-29: qty 1

## 2022-09-29 MED ORDER — FENTANYL CITRATE (PF) 100 MCG/2ML IJ SOLN
INTRAMUSCULAR | Status: DC | PRN
  Administered 2022-09-29: 100 ug via INTRAVENOUS
  Administered 2022-09-29: 50 ug via INTRAVENOUS

## 2022-09-29 MED ORDER — DEXAMETHASONE SODIUM PHOSPHATE 10 MG/ML IJ SOLN
INTRAMUSCULAR | Status: AC
  Filled 2022-09-29: qty 1

## 2022-09-29 MED ORDER — LIDOCAINE HCL (PF) 2 % IJ SOLN
INTRAMUSCULAR | Status: AC
  Filled 2022-09-29: qty 5

## 2022-09-29 MED ORDER — ROCURONIUM BROMIDE 100 MG/10ML IV SOLN
INTRAVENOUS | Status: DC | PRN
  Administered 2022-09-29: 50 mg via INTRAVENOUS

## 2022-09-29 MED ORDER — OXYCODONE HCL 5 MG/5ML PO SOLN: 5.0000 mg | Freq: Once | ORAL | Status: AC | PRN

## 2022-09-29 MED ORDER — BUPIVACAINE-EPINEPHRINE 0.25% -1:200000 IJ SOLN
INTRAMUSCULAR | Status: DC | PRN
  Administered 2022-09-29: 30 mL

## 2022-09-29 MED ORDER — PROPOFOL 10 MG/ML IV BOLUS
INTRAVENOUS | Status: DC | PRN
  Administered 2022-09-29: 50 mg via INTRAVENOUS
  Administered 2022-09-29: 200 mg via INTRAVENOUS

## 2022-09-29 SURGICAL SUPPLY — 52 items
BAG COUNTER SPONGE SURGICOUNT (BAG) IMPLANT
BAG SPEC THK2 15X12 ZIP CLS (MISCELLANEOUS) ×1
BAG SPNG CNTER NS LX DISP (BAG)
BAG ZIPLOCK 12X15 (MISCELLANEOUS) ×2 IMPLANT
BIT DRILL CLAV LONG 2.2X135 (BIT) IMPLANT
CLSR STERI-STRIP ANTIMIC 1/2X4 (GAUZE/BANDAGES/DRESSINGS) IMPLANT
COVER MAYO STAND STRL (DRAPES) ×2 IMPLANT
COVER SURGICAL LIGHT HANDLE (MISCELLANEOUS) ×2 IMPLANT
DRAPE C-ARM 42X120 X-RAY (DRAPES) ×2 IMPLANT
DRAPE INCISE IOBAN 66X45 STRL (DRAPES) ×2 IMPLANT
DRAPE ORTHO SPLIT 77X108 STRL (DRAPES) ×1
DRAPE SURG 17X11 SM STRL (DRAPES) ×2 IMPLANT
DRAPE SURG ORHT 6 SPLT 77X108 (DRAPES) ×2 IMPLANT
DRAPE U-SHAPE 47X51 STRL (DRAPES) ×2 IMPLANT
DRSG AQUACEL AG ADV 3.5X10 (GAUZE/BANDAGES/DRESSINGS) IMPLANT
DRSG EMULSION OIL 3X16 NADH (GAUZE/BANDAGES/DRESSINGS) ×2 IMPLANT
DURAPREP 26ML APPLICATOR (WOUND CARE) ×2 IMPLANT
GAUZE SPONGE 4X4 12PLY STRL (GAUZE/BANDAGES/DRESSINGS) ×2 IMPLANT
GAUZE XEROFORM 1X8 LF (GAUZE/BANDAGES/DRESSINGS) ×2 IMPLANT
GLOVE BIO SURGEON STRL SZ7.5 (GLOVE) ×4 IMPLANT
GLOVE BIOGEL PI IND STRL 8 (GLOVE) ×2 IMPLANT
GLOVE BIOGEL PI IND STRL 8.5 (GLOVE) ×2 IMPLANT
GOWN STRL REUS W/ TWL XL LVL3 (GOWN DISPOSABLE) ×4 IMPLANT
GOWN STRL REUS W/TWL XL LVL3 (GOWN DISPOSABLE) ×2
K-WIRE TROCHAR TIP ALPS 1.6 (WIRE) ×2
KIT BASIN OR (CUSTOM PROCEDURE TRAY) ×2 IMPLANT
KWIRE TROCHAR TIP ALPS 1.6 (WIRE) IMPLANT
MANIFOLD NEPTUNE II (INSTRUMENTS) ×2 IMPLANT
NS IRRIG 1000ML POUR BTL (IV SOLUTION) ×2 IMPLANT
PACK SHOULDER (CUSTOM PROCEDURE TRAY) ×2 IMPLANT
PLATE CLAV NRW RT 100 10H (Plate) IMPLANT
PROTECTOR NERVE ULNAR (MISCELLANEOUS) ×2 IMPLANT
SCREW 2.7X12MM (Screw) IMPLANT
SCREW BN 14X2.7XNONLOCK 3 LD (Screw) IMPLANT
SCREW LOCK 12X2.7X 3 LD (Screw) IMPLANT
SCREW LOCKING 2.7X12MM (Screw) ×1 IMPLANT
SCREW LP NL 2.7X10MM (Screw) IMPLANT
SCREW NLOCK 2.7X14 (Screw) ×1 IMPLANT
SCREW NONLOCK 2.7X18MM (Screw) IMPLANT
SLING ARM FOAM STRAP LRG (SOFTGOODS) IMPLANT
SLING ARM FOAM STRAP MED (SOFTGOODS) IMPLANT
STAPLER VISISTAT 35W (STAPLE) ×2 IMPLANT
STRIP CLOSURE SKIN 1/2X4 (GAUZE/BANDAGES/DRESSINGS) ×2 IMPLANT
SUCTION TUBE FRAZIER 12FR DISP (SUCTIONS) ×2 IMPLANT
SUT ETHIBOND NAB CT1 #1 30IN (SUTURE) ×6 IMPLANT
SUT MNCRL AB 4-0 PS2 18 (SUTURE) ×2 IMPLANT
SUT VIC AB 1 CT1 27 (SUTURE) ×1
SUT VIC AB 1 CT1 27XBRD ANTBC (SUTURE) ×2 IMPLANT
SUT VIC AB 2-0 CT1 27 (SUTURE) ×1
SUT VIC AB 2-0 CT1 TAPERPNT 27 (SUTURE) ×2 IMPLANT
TOWEL OR 17X26 10 PK STRL BLUE (TOWEL DISPOSABLE) ×4 IMPLANT
WATER STERILE IRR 1000ML POUR (IV SOLUTION) ×2 IMPLANT

## 2022-09-29 NOTE — Op Note (Signed)
   Date of Surgery: 09/29/2022  INDICATIONS: Julia Hughes is a 57 y.o.-year-old female who sustained a right midshaft transverse but displaced greater than 200% clavicle fracture;  The patient did consent to the procedure after discussion of the risks and benefits.  PREOPERATIVE DIAGNOSIS: Right midshaft displaced clavicle fracture  POSTOPERATIVE DIAGNOSIS: Same.  PROCEDURE: Open treatment of right clavicle fracture with internal fixation  SURGEON: Maryan Rued, M.D.  ASSIST: Dion Saucier, PA-C  Assistant attestation:  PA McClung present for the entire procedure..  ANESTHESIA:  general, 1/4% Marcaine with epinephrine 30 cc  IV FLUIDS AND URINE: See anesthesia.  ESTIMATED BLOOD LOSS: minimal mL.  IMPLANTS:    COMPLICATIONS: None.  DESCRIPTION OF PROCEDURE: The patient was brought to the operating room and placed supine on the operating table.  The patient had been signed prior to the procedure and this was documented. The patient had the anesthesia placed by the anesthesiologist.  The patient was then placed in the beach chair position.  All bony prominences were well padded.  A time-out was performed to confirm that this was the correct patient, site, side and location. The patient had an SCD on the lower extremities. The patient did receive antibiotics prior to the incision and was re-dosed during the procedure as needed at indicated intervals.  The patient had the operative extremity prepped and draped in the standard surgical fashion.    A horizontal incision based over the clavicle was used.  Cutaneous nerves were identified and protected as much as possible.  Full thickness flaps were elevated off of the clavicle.  The fracture was exposed.  Any organized hematoma and entrapped periosteum was retrieved from the fracture site. Reduction was obtained using clamps and a superior plate was applied to the clavicle.  The appropriate length was found by using fluoroscopy.  With the  plate in the appropriate position and the fracture reduced.  Nonlocking screws were placed through the plate and into the clavicle.  Care was taken not to plunge with any of the instruments.  Retractors were used as added protection to the neurovascular and pulmonary structures.  Final fluoroscopy pictures were taken to confirm plate placement and fracture reduction.  The wound was thoroughly irrigated and closed in a layer fashion using #1, 0 vicryl, 2.0 vicryl and running subcuticular Monocryl for skin.  Sterile dressings were applied and the patient was extubated and transferred to the PACU in stable condition.  POSTOPERATIVE PLAN: Patient will be in a sling for comfort.  She is allowed to range the shoulder up to the level of the shoulder and not allowed to lift anything.  Maintain the sling for comfort and when out of the house for the first 2 weeks.  I will see in the office in 2 weeks with x-rays of the right clavicle.

## 2022-09-29 NOTE — Discharge Instructions (Addendum)
Orthopedic surgery discharge instructions:  -Maintain postoperative bandage until your follow-up appointment.  This bandage is waterproof and you may shower and that bandage beginning on postoperative day #2.  Please do not submerge the shoulder underwater.  -Maintain your sling at all times unless performing activities of daily living or school/desk work.  No lifting above shoulder height for the first 2 weeks.  You may move the elbow, hand and wrist as tolerated.  Again and you are free to use the right arm for activities of daily living such as eating, getting dressed, and bathing and doing schoolwork.  -Apply ice to the operative shoulder along the surgical bandage for 20 to 30 minutes out of each hour.  Do this as frequently as possible throughout the day.  -For mild to moderate pain you should use Tylenol and or Advil in alternating fashion.  Do this throughout the day and utilize hydrocodone as needed for breakthrough pain. you should try to wean off of the hydrocodone over the next 3-5 days.  -You will return to see Dr. Rogers in the office in 2 weeks for postoperative x-rays and wound check.  

## 2022-09-29 NOTE — Transfer of Care (Signed)
Immediate Anesthesia Transfer of Care Note  Patient: Shaune Spittle  Procedure(s) Performed: OPEN REDUCTION INTERNAL FIXATION (ORIF) CLAVICLE FRACTURE (Right)  Patient Location: PACU  Anesthesia Type:General  Level of Consciousness: sedated and drowsy  Airway & Oxygen Therapy: Patient Spontanous Breathing and Patient connected to face mask oxygen  Post-op Assessment: Report given to RN and Post -op Vital signs reviewed and stable  Post vital signs: stable  Last Vitals:  Vitals Value Taken Time  BP 147/73 09/29/22 0920  Temp    Pulse 80 09/29/22 0921  Resp 16 09/29/22 0921  SpO2 100 % 09/29/22 0921  Vitals shown include unvalidated device data.  Last Pain:  Vitals:   09/29/22 0601  TempSrc:   PainSc: 0-No pain      Patients Stated Pain Goal: 4 (09/29/22 0601)  Complications: No notable events documented.

## 2022-09-29 NOTE — Brief Op Note (Signed)
09/29/2022  9:08 AM  PATIENT:  Julia Hughes  57 y.o. female  PRE-OPERATIVE DIAGNOSIS:  Right midshaft clavicle fracture  POST-OPERATIVE DIAGNOSIS:  Right midshaft clavicle fracture  PROCEDURE:  Procedure(s) with comments: OPEN REDUCTION INTERNAL FIXATION (ORIF) CLAVICLE FRACTURE (Right) - 110  SURGEON:  Surgeon(s) and Role:    Aundria Rud, Noah Delaine, MD - Primary  PHYSICIAN ASSISTANT: Dion Saucier, PA-C   ANESTHESIA:   local and general  EBL:  25 mL   BLOOD ADMINISTERED:none  DRAINS: none   LOCAL MEDICATIONS USED:  MARCAINE     SPECIMEN:  No Specimen  DISPOSITION OF SPECIMEN:  N/A  COUNTS:  YES  TOURNIQUET:  * No tourniquets in log *  DICTATION: .Note written in EPIC  PLAN OF CARE: Discharge to home after PACU  PATIENT DISPOSITION:  PACU - hemodynamically stable.   Delay start of Pharmacological VTE agent (>24hrs) due to surgical blood loss or risk of bleeding: not applicable

## 2022-09-29 NOTE — H&P (Signed)
ORTHOPAEDIC H and P  REQUESTING PHYSICIAN: Yolonda Kida, MD  PCP:  [name], DO  Chief Complaint: Right clavicle fracture  HPI: Julia Hughes is a 57 y.o. female who complains of right shoulder pain and deformity following a fall last weekend.  She was evaluated in the office this week and found to have a displaced right midshaft clavicle fracture and presents todayu for surgery.  No new complaints today.  Past Medical History:  Diagnosis Date   Diabetes mellitus without complication (HCC)    Heart murmur    Hypothyroidism    Past Surgical History:  Procedure Laterality Date   BREAST BIOPSY Right    over 20 years   BREAST CYST EXCISION Right    over 20 years   KNEE ARTHROSCOPY Right    TOTAL THYROIDECTOMY     Social History   Socioeconomic History   Marital status: Married    Spouse name: Not on file   Number of children: Not on file   Years of education: Not on file   Highest education level: Not on file  Occupational History   Not on file  Tobacco Use   Smoking status: Never   Smokeless tobacco: Never  Vaping Use   Vaping Use: Never used  Substance and Sexual Activity   Alcohol use: Never   Drug use: Never   Sexual activity: Not on file  Other Topics Concern   Not on file  Social History Narrative   Not on file   Social Determinants of Health   Financial Resource Strain: Not on file  Food Insecurity: Not on file  Transportation Needs: Not on file  Physical Activity: Not on file  Stress: Not on file  Social Connections: Not on file   Family History  Problem Relation Age of Onset   Breast cancer Neg Hx    No Known Allergies Prior to Admission medications   Medication Sig Start Date End Date Taking? Authorizing Provider  acetaminophen (TYLENOL) 650 MG CR tablet Take 650 mg by mouth every 8 (eight) hours as needed for pain.   Yes [provider]  estradiol (ESTRACE) 0.1 MG/GM vaginal cream Place 1 g vaginally at bedtime  for 14 days then twice weekly 03/06/22  Yes   ibuprofen (ADVIL) 200 MG tablet Take 200 mg by mouth every 6 (six) hours as needed for mild pain.   Yes [provider]  thyroid (ARMOUR THYROID) 90 MG tablet Take 1 tablet (90 mg total) by mouth on an empty stomach on Monday through Saturday 04/06/22  Yes   estradiol (ESTRACE) 0.1 MG/GM vaginal cream Insert 1 gm per vagina at bedtime for 14 days,then use 1 gram vaginally 2 times weekly. Patient not taking: Reported on 09/28/2022 02/14/21     thyroid (ARMOUR) 90 MG tablet TAKE 1 TABLET BY MOUTH ONCE DAILY. Patient not taking: Reported on 09/28/2022 03/08/20 03/29/21  Dorisann Frames, MD  Zoster Vaccine Adjuvanted [facility]) injection Inject into the muscle. 03/29/22      No results found.  Positive ROS: All other systems have been reviewed and were otherwise negative with the exception of those mentioned in the HPI and as above.  Physical Exam: General: Alert, no acute distress Cardiovascular: No pedal edema Respiratory: No cyanosis, no use of accessory musculature GI: No organomegaly, abdomen is soft and non-tender Skin: No lesions in the area of chief complaint Neurologic: Sensation intact distally Psychiatric: Patient is competent for consent with normal mood and affect Lymphatic: No axillary  or cervical lymphadenopathy  MUSCULOSKELETAL:  RUE is wwp.  Bruising noted along clavicle and pectoralis fascia.  NVI  Assessment: Right midshaft, displaced fracture of clavicle  Plan: - plan for ORIF of right clavicle today -The risks, benefits, and alternatives were discussed with the patient. There are risks associated with the surgery including, but not limited to, problems with anesthesia (death), infection, differences in leg length/angulation/rotation, fracture of bones, loosening or failure of implants, malunion, nonunion, hematoma (blood accumulation) which may require surgical drainage, blood clots, pulmonary embolism, nerve injury (foot  drop), and blood vessel injury. The patient understands these risks and elects to proceed.   =-dc home post op.    Yolonda Kida, MD Cell [phone]    09/29/2022 6:00 AM

## 2022-09-29 NOTE — Anesthesia Postprocedure Evaluation (Signed)
Anesthesia Post Note  Patient: Julia Hughes  Procedure(s) Performed: OPEN REDUCTION INTERNAL FIXATION (ORIF) CLAVICLE FRACTURE (Right)     Patient location during evaluation: PACU Anesthesia Type: General Level of consciousness: awake Pain management: pain level controlled Vital Signs Assessment: post-procedure vital signs reviewed and stable Respiratory status: spontaneous breathing, nonlabored ventilation and respiratory function stable Cardiovascular status: blood pressure returned to baseline and stable Postop Assessment: no apparent nausea or vomiting Anesthetic complications: no   No notable events documented.  Last Vitals:  Vitals:   09/29/22 1030 09/29/22 1040  BP: (!) 144/83   Pulse: 69 74  Resp: (!) 24 13  Temp: 36.9 C   SpO2: 100% 100%    Last Pain:  Vitals:   09/29/22 1030  TempSrc: Temporal  PainSc: 8                  Linton Rump

## 2022-09-29 NOTE — Anesthesia Procedure Notes (Signed)
Procedure Name: Intubation Date/Time: 09/29/2022 7:32 AM  Performed by: Donna Bernard, CRNAPre-anesthesia Checklist: Patient identified, Emergency Drugs available, Suction available, Patient being monitored and Timeout performed Patient Re-evaluated:Patient Re-evaluated prior to induction Oxygen Delivery Method: Circle system utilized Preoxygenation: Pre-oxygenation with 100% oxygen Induction Type: IV induction Ventilation: Mask ventilation without difficulty Laryngoscope Size: Miller and 2 Grade View: Grade I Tube type: Oral Tube size: 7.0 mm Number of attempts: 1 Airway Equipment and Method: Stylet Placement Confirmation: positive ETCO2, ETT inserted through vocal cords under direct vision, CO2 detector and breath sounds checked- equal and bilateral Secured at: 22 cm Tube secured with: Tape Dental Injury: Teeth and Oropharynx as per pre-operative assessment

## 2022-10-03 ENCOUNTER — Encounter (HOSPITAL_COMMUNITY): Payer: Self-pay | Admitting: Orthopedic Surgery

## 2022-10-13 DIAGNOSIS — S52501D Unspecified fracture of the lower end of right radius, subsequent encounter for closed fracture with routine healing: Secondary | ICD-10-CM | POA: Diagnosis not present

## 2022-10-13 DIAGNOSIS — Z6831 Body mass index (BMI) 31.0-31.9, adult: Secondary | ICD-10-CM | POA: Diagnosis not present

## 2022-10-13 DIAGNOSIS — R739 Hyperglycemia, unspecified: Secondary | ICD-10-CM | POA: Diagnosis not present

## 2022-10-13 DIAGNOSIS — R9389 Abnormal findings on diagnostic imaging of other specified body structures: Secondary | ICD-10-CM | POA: Diagnosis not present

## 2022-10-17 ENCOUNTER — Ambulatory Visit
Admission: RE | Admit: 2022-10-17 | Discharge: 2022-10-17 | Disposition: A | Discharge status: Home / Self Care | Payer: 59 | Source: Ambulatory Visit | Attending: Family Medicine | Admitting: Family Medicine

## 2022-10-17 ENCOUNTER — Other Ambulatory Visit: Payer: Self-pay | Admitting: Family Medicine

## 2022-10-17 DIAGNOSIS — R9389 Abnormal findings on diagnostic imaging of other specified body structures: Secondary | ICD-10-CM

## 2022-10-17 DIAGNOSIS — Z4789 Encounter for other orthopedic aftercare: Secondary | ICD-10-CM | POA: Diagnosis not present

## 2022-10-17 DIAGNOSIS — R918 Other nonspecific abnormal finding of lung field: Secondary | ICD-10-CM | POA: Diagnosis not present

## 2022-10-26 ENCOUNTER — Ambulatory Visit
Admission: RE | Admit: 2022-10-26 | Discharge: 2022-10-26 | Disposition: A | Discharge status: Home / Self Care | Payer: 59 | Source: Ambulatory Visit | Attending: Family Medicine | Admitting: Family Medicine

## 2022-10-26 ENCOUNTER — Other Ambulatory Visit: Payer: Self-pay | Admitting: Family Medicine

## 2022-10-26 DIAGNOSIS — R930 Abnormal findings on diagnostic imaging of skull and head, not elsewhere classified: Secondary | ICD-10-CM | POA: Diagnosis not present

## 2022-10-26 DIAGNOSIS — R9389 Abnormal findings on diagnostic imaging of other specified body structures: Secondary | ICD-10-CM

## 2022-11-06 DIAGNOSIS — M25511 Pain in right shoulder: Secondary | ICD-10-CM | POA: Diagnosis not present

## 2022-11-14 DIAGNOSIS — M25511 Pain in right shoulder: Secondary | ICD-10-CM | POA: Diagnosis not present

## 2022-11-15 DIAGNOSIS — Z4789 Encounter for other orthopedic aftercare: Secondary | ICD-10-CM | POA: Diagnosis not present

## 2022-11-17 DIAGNOSIS — M25511 Pain in right shoulder: Secondary | ICD-10-CM | POA: Diagnosis not present

## 2022-11-21 DIAGNOSIS — M25511 Pain in right shoulder: Secondary | ICD-10-CM | POA: Diagnosis not present

## 2022-11-24 DIAGNOSIS — M25511 Pain in right shoulder: Secondary | ICD-10-CM | POA: Diagnosis not present

## 2022-11-28 DIAGNOSIS — M25511 Pain in right shoulder: Secondary | ICD-10-CM | POA: Diagnosis not present

## 2022-11-30 DIAGNOSIS — M25511 Pain in right shoulder: Secondary | ICD-10-CM | POA: Diagnosis not present

## 2022-12-05 DIAGNOSIS — M25511 Pain in right shoulder: Secondary | ICD-10-CM | POA: Diagnosis not present

## 2022-12-08 DIAGNOSIS — M25511 Pain in right shoulder: Secondary | ICD-10-CM | POA: Diagnosis not present

## 2022-12-12 DIAGNOSIS — M25511 Pain in right shoulder: Secondary | ICD-10-CM | POA: Diagnosis not present

## 2022-12-12 IMAGING — MG MM DIGITAL DIAGNOSTIC UNILAT*R* W/ TOMO W/ CAD
6 series · 6 of 18 positions shown · non-contrast
Comparison: Previous exam(s).

CLINICAL DATA: Patient returns after screening study for evaluation
of possible RIGHT breast distortion.

EXAM:
DIGITAL DIAGNOSTIC UNILATERAL RIGHT MAMMOGRAM WITH TOMOSYNTHESIS AND
CAD
TECHNIQUE: Right digital diagnostic mammography and breast tomosynthesis was
performed. The images were evaluated with computer-aided detection.

[R MLO synth-2D (1 of 2)]
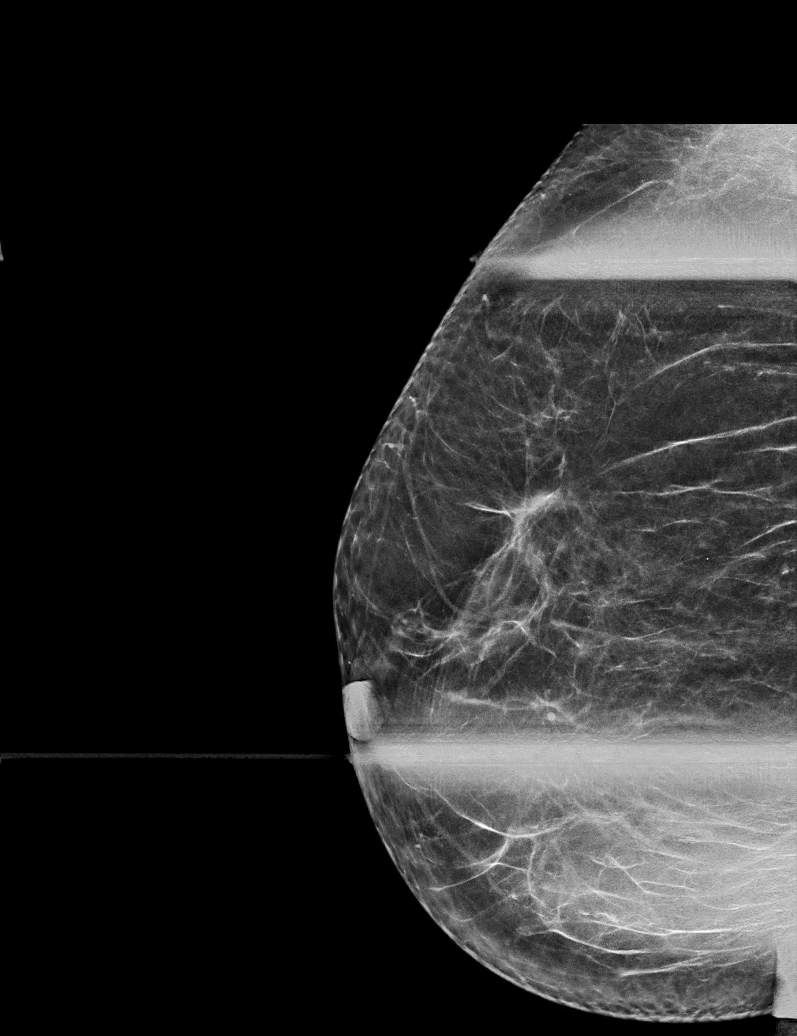

[R ML synth-2D]
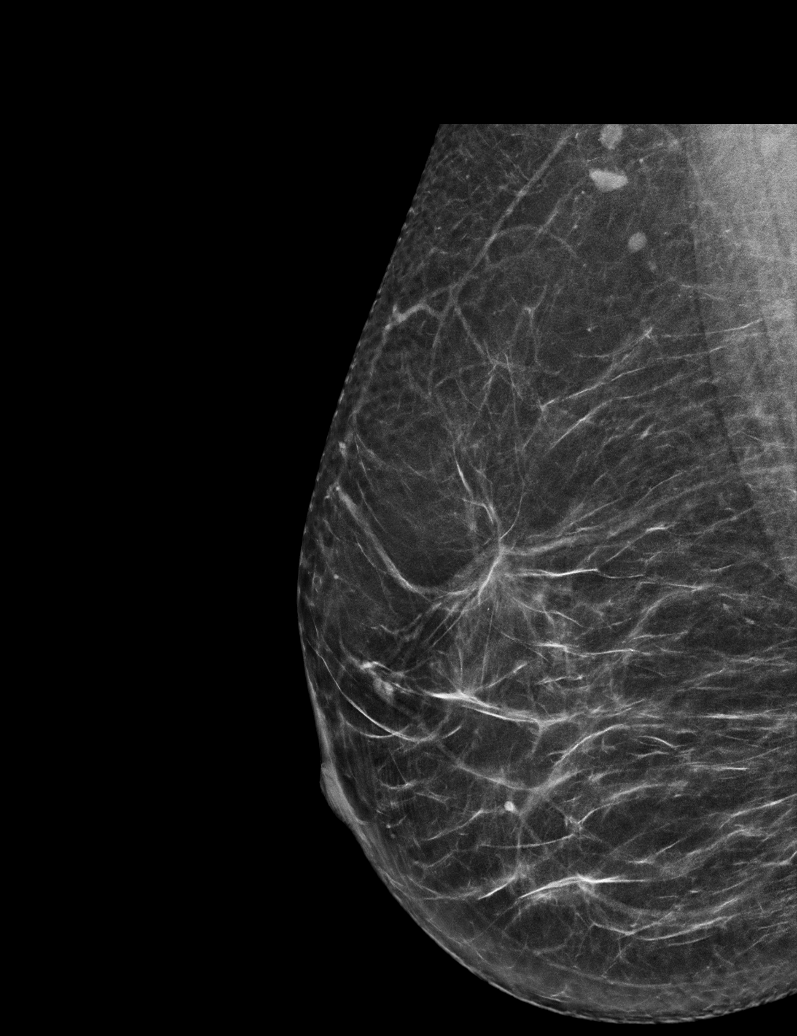

[R MLO synth-2D (2 of 2)]
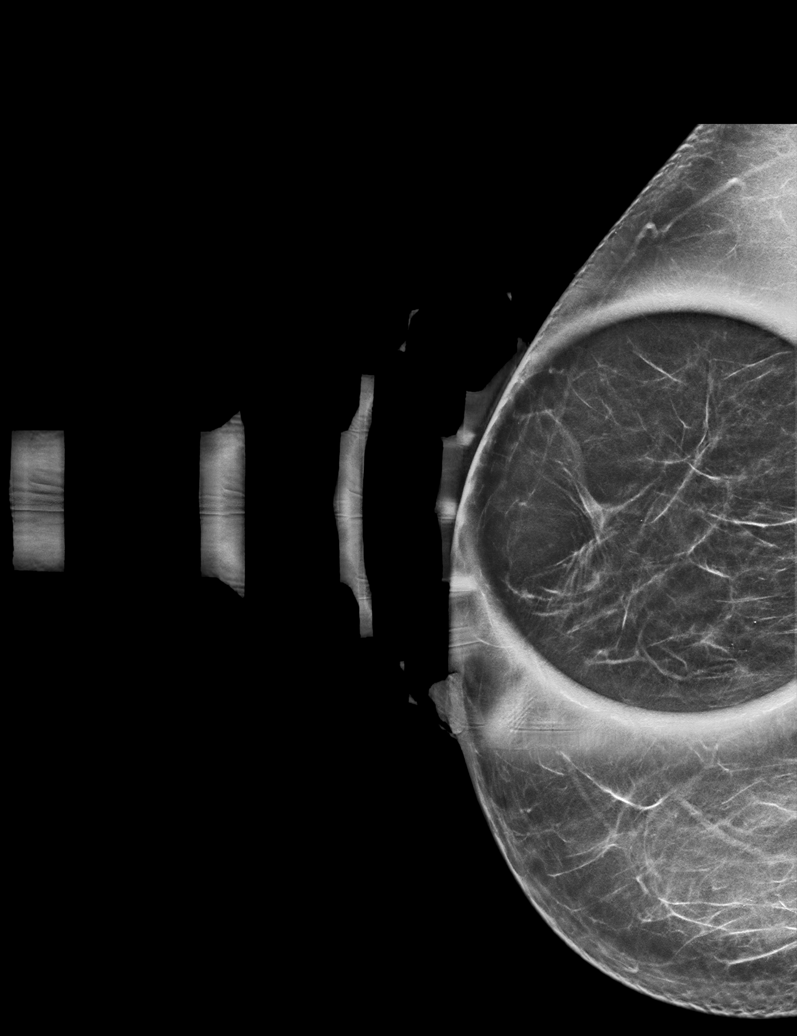

[R ML tomo · tomo slice 31/60.0]
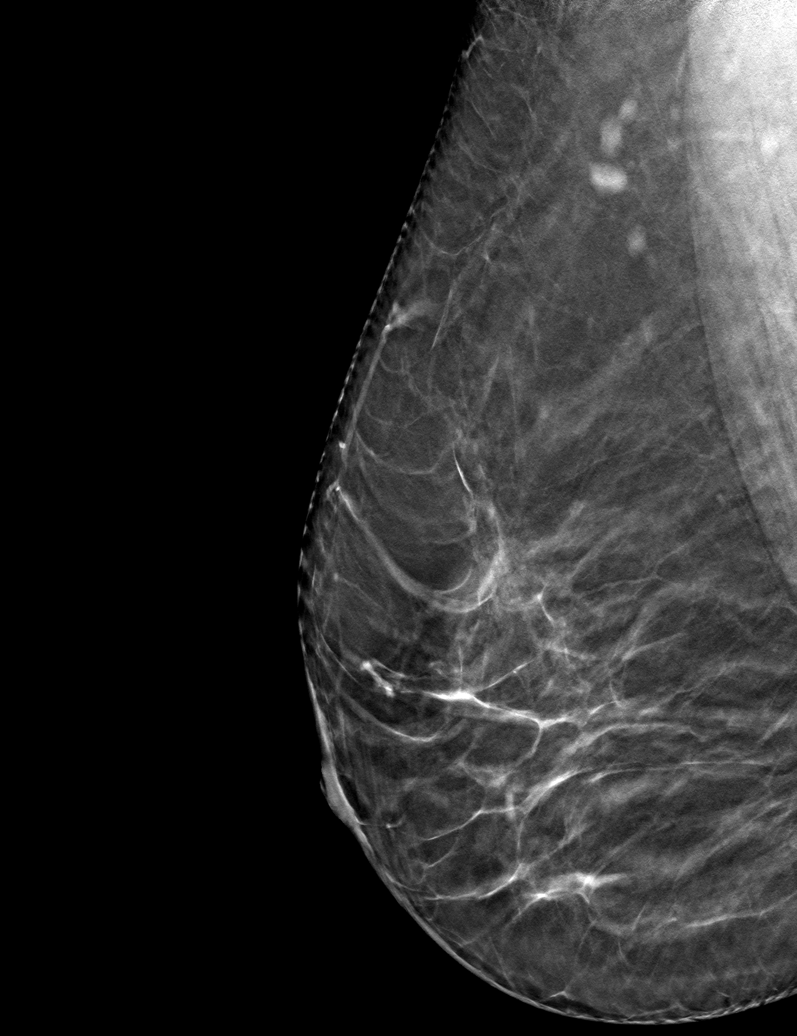

[R MLO tomo (1 of 2) · tomo slice 25/50.0]
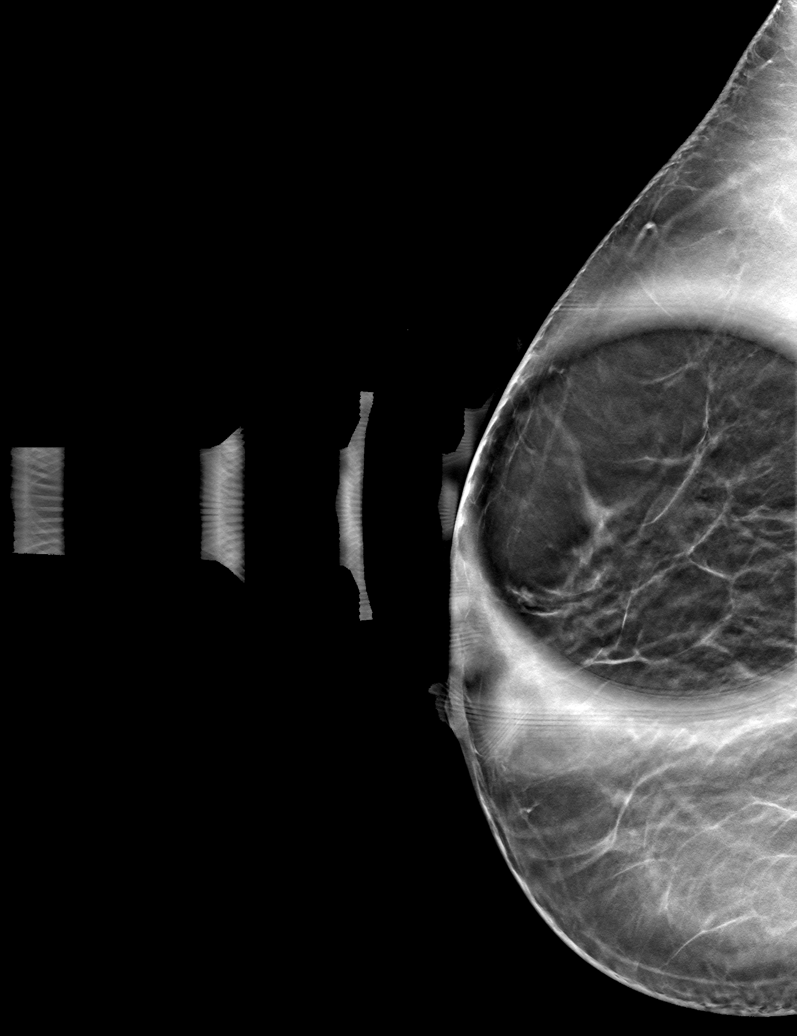

[R MLO tomo (2 of 2) · tomo slice 29/56.0]
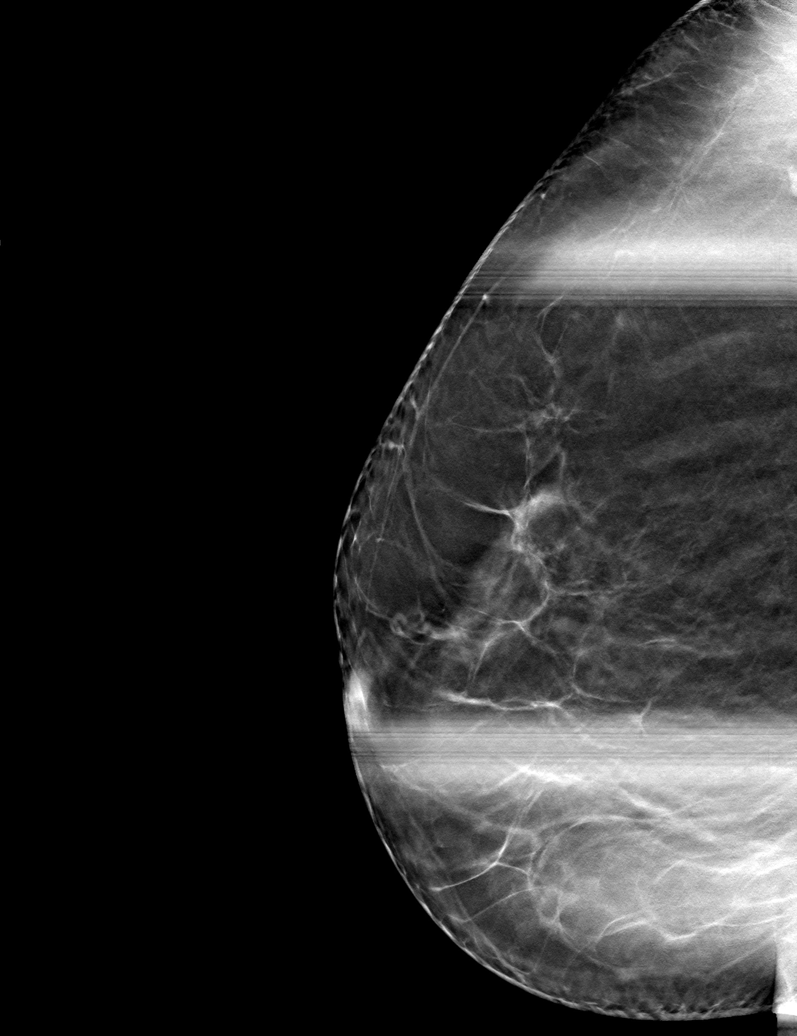

[6 of 18 positions shown; findings below may reference images not displayed]

ACR Breast Density Category b: There are scattered areas of
fibroglandular density.
FINDINGS: Additional 2-D and 3-D images are performed. These views show
persistent asymmetry in the UPPER-OUTER QUADRANT of the RIGHT
breast. This correlates well with a site of surgical excision
performed 20-25 years ago. No non surgical distortion or other
abnormality identified.
IMPRESSION: No mammographic evidence for malignancy.

Post surgical changes in the UPPER-OUTER QUADRANT of the RIGHT
breast accounting for the questioned abnormality.

RECOMMENDATION:
Screening mammogram in one year.(Code:F7-0-5IG)

I have discussed the findings and recommendations with the patient.
If applicable, a reminder letter will be sent to the patient
regarding the next appointment.

BI-RADS CATEGORY  2: Benign.

## 2022-12-18 DIAGNOSIS — E7800 Pure hypercholesterolemia, unspecified: Secondary | ICD-10-CM | POA: Diagnosis not present

## 2022-12-18 DIAGNOSIS — E890 Postprocedural hypothyroidism: Secondary | ICD-10-CM | POA: Diagnosis not present

## 2022-12-18 DIAGNOSIS — R7301 Impaired fasting glucose: Secondary | ICD-10-CM | POA: Diagnosis not present

## 2022-12-18 DIAGNOSIS — E559 Vitamin D deficiency, unspecified: Secondary | ICD-10-CM | POA: Diagnosis not present

## 2022-12-22 ENCOUNTER — Other Ambulatory Visit (HOSPITAL_COMMUNITY): Payer: Self-pay

## 2022-12-22 DIAGNOSIS — R7301 Impaired fasting glucose: Secondary | ICD-10-CM | POA: Diagnosis not present

## 2022-12-22 DIAGNOSIS — E559 Vitamin D deficiency, unspecified: Secondary | ICD-10-CM | POA: Diagnosis not present

## 2022-12-22 DIAGNOSIS — E7800 Pure hypercholesterolemia, unspecified: Secondary | ICD-10-CM | POA: Diagnosis not present

## 2022-12-22 DIAGNOSIS — E890 Postprocedural hypothyroidism: Secondary | ICD-10-CM | POA: Diagnosis not present

## 2022-12-22 MED ORDER — THYROID 60 MG PO TABS
60.0000 mg | ORAL_TABLET | Freq: Every day | ORAL | 5 refills | Status: DC
  Filled 2022-12-22: qty 90, 90d supply, fill #0
  Filled 2023-03-21 (×2): qty 90, 90d supply, fill #1
  Filled 2023-06-11: qty 90, 90d supply, fill #2
  Filled 2023-09-17: qty 90, 90d supply, fill #3
  Filled 2023-12-14 (×3): qty 90, 90d supply, fill #4

## 2022-12-22 MED ORDER — THYROID 15 MG PO TABS
15.0000 mg | ORAL_TABLET | Freq: Every day | ORAL | 5 refills | Status: DC
  Filled 2022-12-22: qty 90, 90d supply, fill #0
  Filled 2023-03-21: qty 90, 90d supply, fill #1
  Filled 2023-03-21: qty 61, 61d supply, fill #1
  Filled 2023-03-21: qty 29, 29d supply, fill #1
  Filled 2023-06-11: qty 90, 90d supply, fill #2
  Filled 2023-09-17: qty 90, 90d supply, fill #3
  Filled 2023-12-14 (×6): qty 90, 90d supply, fill #4

## 2022-12-28 ENCOUNTER — Other Ambulatory Visit (HOSPITAL_COMMUNITY): Payer: Self-pay

## 2022-12-28 DIAGNOSIS — Z4789 Encounter for other orthopedic aftercare: Secondary | ICD-10-CM | POA: Diagnosis not present

## 2023-01-04 ENCOUNTER — Other Ambulatory Visit (HOSPITAL_COMMUNITY): Payer: Self-pay

## 2023-01-04 MED ORDER — FREESTYLE LANCETS MISC
1.0000 | Freq: Every day | 7 refills | Status: AC
Start: 2023-01-04 — End: ?
  Filled 2023-01-04: qty 100, 90d supply, fill #0
  Filled 2023-11-28: qty 100, 90d supply, fill #1

## 2023-01-04 MED ORDER — GLUCOSE BLOOD VI STRP
1.0000 | ORAL_STRIP | Freq: Every day | 4 refills | Status: AC
  Filled 2023-01-04: qty 50, 50d supply, fill #0
  Filled 2023-03-06: qty 50, 50d supply, fill #1
  Filled 2023-05-04: qty 50, 50d supply, fill #2
  Filled 2023-11-28: qty 100, 90d supply, fill #3

## 2023-02-22 DIAGNOSIS — E890 Postprocedural hypothyroidism: Secondary | ICD-10-CM | POA: Diagnosis not present

## 2023-03-05 ENCOUNTER — Other Ambulatory Visit (HOSPITAL_COMMUNITY): Payer: Self-pay

## 2023-03-05 DIAGNOSIS — R350 Frequency of micturition: Secondary | ICD-10-CM | POA: Diagnosis not present

## 2023-03-05 DIAGNOSIS — Z01419 Encounter for gynecological examination (general) (routine) without abnormal findings: Secondary | ICD-10-CM | POA: Diagnosis not present

## 2023-03-05 DIAGNOSIS — Z1231 Encounter for screening mammogram for malignant neoplasm of breast: Secondary | ICD-10-CM | POA: Diagnosis not present

## 2023-03-05 MED ORDER — VALACYCLOVIR HCL 1 G PO TABS
2.0000 g | ORAL_TABLET | Freq: Two times a day (BID) | ORAL | 0 refills | Status: AC
  Filled 2023-03-05: qty 24, 6d supply, fill #0

## 2023-03-05 MED ORDER — ESTRADIOL 0.1 MG/GM VA CREA
1.0000 g | TOPICAL_CREAM | VAGINAL | 6 refills | Status: AC
  Filled 2023-03-05: qty 42.5, 90d supply, fill #0
  Filled 2023-06-22: qty 42.5, 90d supply, fill #1

## 2023-03-21 ENCOUNTER — Other Ambulatory Visit: Payer: Self-pay

## 2023-03-21 ENCOUNTER — Other Ambulatory Visit (HOSPITAL_COMMUNITY): Payer: Self-pay

## 2023-04-03 DIAGNOSIS — S42001D Fracture of unspecified part of right clavicle, subsequent encounter for fracture with routine healing: Secondary | ICD-10-CM | POA: Diagnosis not present

## 2023-05-04 ENCOUNTER — Other Ambulatory Visit (HOSPITAL_COMMUNITY): Payer: Self-pay

## 2023-05-08 DIAGNOSIS — E890 Postprocedural hypothyroidism: Secondary | ICD-10-CM | POA: Diagnosis not present

## 2023-05-08 DIAGNOSIS — R7301 Impaired fasting glucose: Secondary | ICD-10-CM | POA: Diagnosis not present

## 2023-05-08 DIAGNOSIS — E7800 Pure hypercholesterolemia, unspecified: Secondary | ICD-10-CM | POA: Diagnosis not present

## 2023-05-08 DIAGNOSIS — E559 Vitamin D deficiency, unspecified: Secondary | ICD-10-CM | POA: Diagnosis not present

## 2023-06-11 ENCOUNTER — Other Ambulatory Visit: Payer: Self-pay

## 2023-06-11 ENCOUNTER — Other Ambulatory Visit (HOSPITAL_COMMUNITY): Payer: Self-pay

## 2023-06-12 ENCOUNTER — Other Ambulatory Visit: Payer: Self-pay

## 2023-06-12 ENCOUNTER — Other Ambulatory Visit (HOSPITAL_COMMUNITY): Payer: Self-pay

## 2023-06-15 ENCOUNTER — Other Ambulatory Visit (HOSPITAL_COMMUNITY): Payer: Self-pay

## 2023-06-22 ENCOUNTER — Other Ambulatory Visit (HOSPITAL_COMMUNITY): Payer: Self-pay

## 2023-06-22 ENCOUNTER — Other Ambulatory Visit: Payer: Self-pay

## 2023-07-04 ENCOUNTER — Other Ambulatory Visit (HOSPITAL_COMMUNITY): Payer: Self-pay

## 2023-07-24 DIAGNOSIS — E7800 Pure hypercholesterolemia, unspecified: Secondary | ICD-10-CM | POA: Diagnosis not present

## 2023-07-24 DIAGNOSIS — E559 Vitamin D deficiency, unspecified: Secondary | ICD-10-CM | POA: Diagnosis not present

## 2023-07-24 DIAGNOSIS — R7303 Prediabetes: Secondary | ICD-10-CM | POA: Diagnosis not present

## 2023-08-07 DIAGNOSIS — R7303 Prediabetes: Secondary | ICD-10-CM | POA: Diagnosis not present

## 2023-08-07 DIAGNOSIS — E7800 Pure hypercholesterolemia, unspecified: Secondary | ICD-10-CM | POA: Diagnosis not present

## 2023-08-13 DIAGNOSIS — Z970 Presence of artificial eye: Secondary | ICD-10-CM | POA: Diagnosis not present

## 2023-08-13 DIAGNOSIS — Q112 Microphthalmos: Secondary | ICD-10-CM | POA: Diagnosis not present

## 2023-08-13 DIAGNOSIS — H2511 Age-related nuclear cataract, right eye: Secondary | ICD-10-CM | POA: Diagnosis not present

## 2023-08-17 DIAGNOSIS — E890 Postprocedural hypothyroidism: Secondary | ICD-10-CM | POA: Diagnosis not present

## 2023-08-24 ENCOUNTER — Other Ambulatory Visit (HOSPITAL_COMMUNITY): Payer: Self-pay | Admitting: Endocrinology

## 2023-08-24 DIAGNOSIS — E559 Vitamin D deficiency, unspecified: Secondary | ICD-10-CM | POA: Diagnosis not present

## 2023-08-24 DIAGNOSIS — E890 Postprocedural hypothyroidism: Secondary | ICD-10-CM | POA: Diagnosis not present

## 2023-08-24 DIAGNOSIS — E7800 Pure hypercholesterolemia, unspecified: Secondary | ICD-10-CM

## 2023-08-24 DIAGNOSIS — R7301 Impaired fasting glucose: Secondary | ICD-10-CM | POA: Diagnosis not present

## 2023-09-17 ENCOUNTER — Other Ambulatory Visit (HOSPITAL_COMMUNITY): Payer: Self-pay

## 2023-09-19 ENCOUNTER — Other Ambulatory Visit (HOSPITAL_COMMUNITY): Payer: Self-pay

## 2023-10-09 ENCOUNTER — Ambulatory Visit (HOSPITAL_COMMUNITY)
Admission: RE | Admit: 2023-10-09 | Discharge: 2023-10-09 | Disposition: A | Discharge status: Home / Self Care | Payer: Self-pay | Source: Ambulatory Visit | Attending: Endocrinology | Admitting: Endocrinology

## 2023-10-09 DIAGNOSIS — E7800 Pure hypercholesterolemia, unspecified: Secondary | ICD-10-CM | POA: Insufficient documentation

## 2023-11-28 ENCOUNTER — Other Ambulatory Visit: Payer: Self-pay

## 2023-12-03 ENCOUNTER — Encounter (HOSPITAL_BASED_OUTPATIENT_CLINIC_OR_DEPARTMENT_OTHER): Payer: Self-pay

## 2023-12-03 ENCOUNTER — Emergency Department (HOSPITAL_BASED_OUTPATIENT_CLINIC_OR_DEPARTMENT_OTHER)
Admission: EM | Admit: 2023-12-03 | Discharge: 2023-12-03 | Disposition: A | Discharge status: Home / Self Care | Attending: Emergency Medicine | Admitting: Emergency Medicine

## 2023-12-03 ENCOUNTER — Other Ambulatory Visit: Payer: Self-pay

## 2023-12-03 ENCOUNTER — Other Ambulatory Visit (HOSPITAL_COMMUNITY): Payer: Self-pay

## 2023-12-03 DIAGNOSIS — Z23 Encounter for immunization: Secondary | ICD-10-CM | POA: Insufficient documentation

## 2023-12-03 DIAGNOSIS — S61451A Open bite of right hand, initial encounter: Secondary | ICD-10-CM | POA: Insufficient documentation

## 2023-12-03 DIAGNOSIS — W540XXA Bitten by dog, initial encounter: Secondary | ICD-10-CM | POA: Insufficient documentation

## 2023-12-03 DIAGNOSIS — R030 Elevated blood-pressure reading, without diagnosis of hypertension: Secondary | ICD-10-CM | POA: Insufficient documentation

## 2023-12-03 DIAGNOSIS — I10 Essential (primary) hypertension: Secondary | ICD-10-CM | POA: Diagnosis not present

## 2023-12-03 MED ORDER — TETANUS-DIPHTH-ACELL PERTUSSIS 5-2.5-18.5 LF-MCG/0.5 IM SUSY
0.5000 mL | PREFILLED_SYRINGE | Freq: Once | INTRAMUSCULAR | Status: AC
  Administered 2023-12-03: 0.5 mL via INTRAMUSCULAR
  Filled 2023-12-03: qty 0.5

## 2023-12-03 MED ORDER — ACETAMINOPHEN 500 MG PO TABS
1000.0000 mg | ORAL_TABLET | Freq: Once | ORAL | Status: AC
  Administered 2023-12-03: 1000 mg via ORAL
  Filled 2023-12-03: qty 2

## 2023-12-03 MED ORDER — AMOXICILLIN-POT CLAVULANATE 875-125 MG PO TABS
1.0000 | ORAL_TABLET | Freq: Two times a day (BID) | ORAL | 0 refills | Status: AC
  Filled 2023-12-03: qty 10, 5d supply, fill #0

## 2023-12-03 MED ORDER — AMOXICILLIN-POT CLAVULANATE 875-125 MG PO TABS
1.0000 | ORAL_TABLET | Freq: Once | ORAL | Status: AC
  Administered 2023-12-03: 1 via ORAL
  Filled 2023-12-03: qty 1

## 2023-12-03 NOTE — Discharge Instructions (Addendum)
 It was our pleasure to provide your ER care today - we hope that you feel better.  Keep wound very clean. Take acetaminophen  or ibuprofen as need.  Take augmentin  (antibiotic) as prescribed.   Your blood pressure is high tonight - follow up with primary care doctor in the next few weeks.   Return to ER if worse, new symptoms, infection of wounds (spreading redness, increased swelling, severe pain, pus from wound, etc.), or other concern.

## 2023-12-03 NOTE — ED Triage Notes (Signed)
 Patient got bit by her own dog after trying to get some chicken out of it's mouth tonight. Patient states the dog is up to date on vaccines. Patient thinks her tetanus is overdue. She has a puncture mark on the right hand. There is some swelling and redness noted.

## 2023-12-03 NOTE — ED Provider Notes (Signed)
 [facility] AT [facility] Provider Note   CSN: [id number] Arrival date & time: 12/03/23  2020     Patient presents with: Animal Bite (Dog)   Julia Hughes is a 58 y.o. female.   Patient c/o dog bite to right hand tonight. A piece of chicken had fallen and dog was under the table, she reached to retrieve the chicken and dog bit her hand. Dog has been healthy, acting normally, and its shots are up to date. Superficial bite mark/wound to dorsum of right hand. No hand/bone pain. No other injury. Pts last tetanus is not known.    Animal Bite Associated symptoms: no fever and no numbness        Prior to Admission medications   Medication Sig Start Date End Date Taking? Authorizing Provider  acetaminophen  (TYLENOL ) 650 MG CR tablet Take 650 mg by mouth every 8 (eight) hours as needed for pain.    [provider]  estradiol  (ESTRACE ) 0.1 MG/GM vaginal cream Insert 1 gm per vagina at bedtime for 14 days,then use 1 gram vaginally 2 times weekly. Patient not taking: Reported on 09/28/2022 02/14/21     estradiol  (ESTRACE ) 0.1 MG/GM vaginal cream Place 1 g vaginally at bedtime for 14 days then twice weekly 03/06/22     estradiol  (ESTRACE ) 0.1 MG/GM vaginal cream Place 1 g vaginally 2 (two) times a week. 03/05/23     glucose blood test strip Use to check blood sugar once daily 01/04/23     ibuprofen (ADVIL) 200 MG tablet Take 200 mg by mouth every 6 (six) hours as needed for mild pain.    [provider]  Lancets (FREESTYLE) lancets Use as directed to test blood sugar once daily 01/04/23     ondansetron  (ZOFRAN ) 4 MG tablet Take 1 tablet (4 mg total) by mouth every 8 (eight) hours as needed for nausea or vomiting. 09/29/22   Sharl Selinda Dover, MD  oxyCODONE  (ROXICODONE ) 5 MG immediate release tablet Take 1 tablet (5 mg total) by mouth every 6 (six) hours as needed for severe pain or moderate pain. 09/29/22   Sharl Selinda Dover, MD  thyroid   (ARMOUR THYROID ) 15 MG tablet Take 1 tablet (15 mg total) by mouth daily on an empty stomach. 12/22/22     thyroid  (ARMOUR THYROID ) 60 MG tablet Take 1 tablet (60 mg total) by mouth daily on an empty stomach. 12/22/22     thyroid  (ARMOUR THYROID ) 90 MG tablet Take 1 tablet (90 mg total) by mouth on an empty stomach on Monday through Saturday 04/06/22     thyroid  (ARMOUR) 90 MG tablet TAKE 1 TABLET BY MOUTH ONCE DAILY. Patient not taking: Reported on 09/28/2022 03/08/20 03/29/21  Tommas Pears, MD  valACYclovir  (VALTREX ) 1000 MG tablet Take 2 tablets (2,000 mg total) by mouth 2 (two) times daily at first onset of symptoms for 1 day. 03/05/23     Zoster Vaccine Adjuvanted (SHINGRIX ) injection Inject into the muscle. 03/29/22       Allergies: Patient has no known allergies.    Review of Systems  Constitutional:  Negative for fever.  Musculoskeletal:        Dog bite right hand  Skin:  Positive for wound.  Neurological:  Negative for numbness.    Updated Vital Signs BP (!) 157/93 (BP Location: Left Arm)   Pulse 89   Temp 97.9 F (36.6 C) (Temporal)   Resp 18   SpO2 100%   Physical Exam Vitals and nursing note  reviewed.  Constitutional:      Appearance: Normal appearance. She is well-developed.  HENT:     Head: Atraumatic.  Eyes:     General: No scleral icterus.    Conjunctiva/sclera: Conjunctivae normal.  Neck:     Trachea: No tracheal deviation.  Cardiovascular:     Rate and Rhythm: Normal rate.     Pulses: Normal pulses.  Pulmonary:     Effort: Pulmonary effort is normal. No respiratory distress.  Musculoskeletal:     Cervical back: Neck supple. No muscular tenderness.     Comments: V superficial bite mark/wound to dorsum right hand. No focal bony tenderness. No fb seen or felt. No pain w rom digits. Normal cap refill distally.  No significant sts. Mild bruising.   Skin:    General: Skin is warm and dry.     Findings: No rash.  Neurological:     Mental Status: She is alert.      Comments: Alert, speech normal. Right hand nvi.  Psychiatric:        Mood and Affect: Mood normal.     (all labs ordered are listed, but only abnormal results are displayed) Labs Reviewed - No data to display  EKG: None  Radiology: No results found.   Procedures   Medications Ordered in the ED  amoxicillin -clavulanate (AUGMENTIN ) 875-125 MG per tablet 1 tablet (has no administration in time range)  acetaminophen  (TYLENOL ) tablet 1,000 mg (has no administration in time range)  Tdap (BOOSTRIX ) injection 0.5 mL (has no administration in time range)                                    Medical Decision Making Problems Addressed: Dog bite of right hand, initial encounter: acute illness or injury Elevated blood pressure reading: acute illness or injury  Risk OTC drugs. Prescription drug management.  Confirmed nkda.   Reviewed nursing notes and prior charts for additional history.   Augmentin  po. Tetanus IM.   Wound cleaned/dressing.        Final diagnoses:  None    ED Discharge Orders     None          Bernard Drivers, MD 12/03/23 [phone]

## 2023-12-04 ENCOUNTER — Other Ambulatory Visit (HOSPITAL_COMMUNITY): Payer: Self-pay

## 2023-12-14 ENCOUNTER — Other Ambulatory Visit: Payer: Self-pay

## 2023-12-14 ENCOUNTER — Other Ambulatory Visit (HOSPITAL_COMMUNITY): Payer: Self-pay

## 2024-01-29 DIAGNOSIS — E7800 Pure hypercholesterolemia, unspecified: Secondary | ICD-10-CM | POA: Diagnosis not present

## 2024-01-29 DIAGNOSIS — E559 Vitamin D deficiency, unspecified: Secondary | ICD-10-CM | POA: Diagnosis not present

## 2024-01-29 DIAGNOSIS — Z6833 Body mass index (BMI) 33.0-33.9, adult: Secondary | ICD-10-CM | POA: Diagnosis not present

## 2024-01-29 DIAGNOSIS — E039 Hypothyroidism, unspecified: Secondary | ICD-10-CM | POA: Diagnosis not present

## 2024-01-29 DIAGNOSIS — Z0000 Encounter for general adult medical examination without abnormal findings: Secondary | ICD-10-CM | POA: Diagnosis not present

## 2024-03-11 ENCOUNTER — Other Ambulatory Visit (HOSPITAL_COMMUNITY): Payer: Self-pay

## 2024-03-11 DIAGNOSIS — Z124 Encounter for screening for malignant neoplasm of cervix: Secondary | ICD-10-CM | POA: Diagnosis not present

## 2024-03-11 DIAGNOSIS — Z01419 Encounter for gynecological examination (general) (routine) without abnormal findings: Secondary | ICD-10-CM | POA: Diagnosis not present

## 2024-03-11 DIAGNOSIS — Z1231 Encounter for screening mammogram for malignant neoplasm of breast: Secondary | ICD-10-CM | POA: Diagnosis not present

## 2024-03-11 MED ORDER — VALACYCLOVIR HCL 1 G PO TABS
2000.0000 mg | ORAL_TABLET | Freq: Two times a day (BID) | ORAL | 2 refills | Status: AC
  Filled 2024-03-11: qty 12, 3d supply, fill #0

## 2024-03-18 ENCOUNTER — Other Ambulatory Visit (HOSPITAL_COMMUNITY): Payer: Self-pay

## 2024-03-18 MED ORDER — THYROID 15 MG PO TABS
15.0000 mg | ORAL_TABLET | Freq: Every day | ORAL | 5 refills | Status: AC
  Filled 2024-03-18: qty 90, 90d supply, fill #0

## 2024-03-18 MED ORDER — THYROID 60 MG PO TABS
60.0000 mg | ORAL_TABLET | Freq: Every day | ORAL | 5 refills | Status: AC
  Filled 2024-03-18: qty 90, 90d supply, fill #0

## 2024-03-21 ENCOUNTER — Other Ambulatory Visit (HOSPITAL_COMMUNITY): Payer: Self-pay
